# Patient Record
Sex: Male | Born: 1970 | Race: White | Hispanic: No | Marital: Married | Smoking: Never smoker
Health system: Southern US, Community
[De-identification: ages and names within clinical notes are randomized; demographics above are authoritative.]

## PROBLEM LIST (undated history)

## (undated) DIAGNOSIS — I10 Essential (primary) hypertension: Secondary | ICD-10-CM

## (undated) DIAGNOSIS — K648 Other hemorrhoids: Secondary | ICD-10-CM

## (undated) DIAGNOSIS — M109 Gout, unspecified: Secondary | ICD-10-CM

## (undated) HISTORY — DX: Other hemorrhoids: K64.8

## (undated) HISTORY — DX: Gout, unspecified: M10.9

## (undated) HISTORY — DX: Essential (primary) hypertension: I10

---

## 1998-03-27 ENCOUNTER — Other Ambulatory Visit: Admission: RE | Admit: 1998-03-27 | Discharge: 1998-03-27 | Payer: Self-pay | Admitting: Urology

## 1999-10-21 ENCOUNTER — Ambulatory Visit (HOSPITAL_COMMUNITY): Admission: RE | Admit: 1999-10-21 | Discharge: 1999-10-21 | Payer: Self-pay | Admitting: Family Medicine

## 1999-10-21 ENCOUNTER — Encounter: Payer: Self-pay | Admitting: Family Medicine

## 2000-03-15 ENCOUNTER — Ambulatory Visit (HOSPITAL_COMMUNITY): Admission: RE | Admit: 2000-03-15 | Discharge: 2000-03-15 | Payer: Self-pay | Admitting: Orthopedic Surgery

## 2000-03-15 ENCOUNTER — Encounter: Payer: Self-pay | Admitting: Orthopedic Surgery

## 2000-12-27 ENCOUNTER — Ambulatory Visit (HOSPITAL_COMMUNITY): Admission: RE | Admit: 2000-12-27 | Discharge: 2000-12-27 | Payer: Self-pay | Admitting: Orthopedic Surgery

## 2000-12-27 ENCOUNTER — Encounter: Payer: Self-pay | Admitting: Orthopedic Surgery

## 2001-08-11 ENCOUNTER — Emergency Department (HOSPITAL_COMMUNITY): Admission: EM | Admit: 2001-08-11 | Discharge: 2001-08-11 | Payer: Self-pay | Admitting: Emergency Medicine

## 2001-08-11 ENCOUNTER — Encounter: Payer: Self-pay | Admitting: Emergency Medicine

## 2003-04-13 ENCOUNTER — Emergency Department (HOSPITAL_COMMUNITY): Admission: EM | Admit: 2003-04-13 | Discharge: 2003-04-13 | Payer: Self-pay | Admitting: Emergency Medicine

## 2004-03-15 ENCOUNTER — Emergency Department (HOSPITAL_COMMUNITY): Admission: AD | Admit: 2004-03-15 | Discharge: 2004-03-15 | Payer: Self-pay | Admitting: Family Medicine

## 2006-11-17 ENCOUNTER — Encounter: Admission: RE | Admit: 2006-11-17 | Discharge: 2006-11-17 | Payer: Self-pay | Admitting: Orthopedic Surgery

## 2006-11-19 ENCOUNTER — Encounter: Admission: RE | Admit: 2006-11-19 | Discharge: 2006-11-19 | Payer: Self-pay | Admitting: Orthopedic Surgery

## 2011-01-03 ENCOUNTER — Encounter: Payer: Self-pay | Admitting: Orthopedic Surgery

## 2011-05-30 ENCOUNTER — Emergency Department (HOSPITAL_COMMUNITY)
Admission: EM | Admit: 2011-05-30 | Discharge: 2011-05-30 | Disposition: A | Discharge status: Home / Self Care | Payer: BC Managed Care – PPO | Attending: Emergency Medicine | Admitting: Emergency Medicine

## 2011-05-30 DIAGNOSIS — H5710 Ocular pain, unspecified eye: Secondary | ICD-10-CM | POA: Insufficient documentation

## 2011-05-30 DIAGNOSIS — S0560XA Penetrating wound without foreign body of unspecified eyeball, initial encounter: Secondary | ICD-10-CM | POA: Insufficient documentation

## 2011-05-30 DIAGNOSIS — H5789 Other specified disorders of eye and adnexa: Secondary | ICD-10-CM | POA: Insufficient documentation

## 2011-05-30 DIAGNOSIS — W278XXA Contact with other nonpowered hand tool, initial encounter: Secondary | ICD-10-CM | POA: Insufficient documentation

## 2011-05-30 DIAGNOSIS — S0010XA Contusion of unspecified eyelid and periocular area, initial encounter: Secondary | ICD-10-CM | POA: Insufficient documentation

## 2013-04-06 ENCOUNTER — Ambulatory Visit (INDEPENDENT_AMBULATORY_CARE_PROVIDER_SITE_OTHER): Payer: BC Managed Care – PPO | Admitting: Physician Assistant

## 2013-04-06 ENCOUNTER — Encounter: Payer: Self-pay | Admitting: Physician Assistant

## 2013-04-06 VITALS — BP 138/90 | HR 80 | Temp 97.2°F | Resp 18 | Ht 67.0 in | Wt 144.0 lb

## 2013-04-06 DIAGNOSIS — J069 Acute upper respiratory infection, unspecified: Secondary | ICD-10-CM

## 2013-04-06 MED ORDER — FLUTICASONE PROPIONATE 50 MCG/ACT NA SUSP: 2.0000 | Freq: Every day | NASAL | Status: DC

## 2013-04-06 NOTE — Progress Notes (Signed)
   Patient ID: [name] MRN: [id number], DOB: 05/21/1971, 42 y.o. Date of Encounter: 04/06/2013, 11:16 AM    Chief Complaint:  Chief Complaint  Patient presents with  . sore throat, sinus infection, green drainage     HPI: 42 y.o. year old male says he started feeling a little bit "bad" 3 days ago. Then 2 days ago felt really bad. Had severe S.T. Also developed a lot of pressure and congestion in his head with green mucus. Yesterday still felt bad but some better. Today still feeling a little better. Has no sore throat now. Now mostly just feels "stopped up" in his nose. Has no chest congestion. No fever/chills.  Taking otc decongestant.      Home Meds: See attached medication section for meds entered today No current outpatient prescriptions on file prior to visit.   No current facility-administered medications on file prior to visit.    Allergies:  Allergies  Allergen Reactions  . Codeine   . Sulfa Antibiotics       Review of Systems: See Hpi for pertinent positives. All others negative.    Physical Exam: Blood pressure 138/90, pulse 80, temperature 97.2 F (36.2 C), temperature source Oral, resp. rate 18, height 5\' 7"  (1.702 m), weight 144 lb (65.318 kg)., Body mass index is 22.55 kg/(m^2). General: Well developed, well nourished,WM. in no acute distress. HEENT: Normocephalic, atraumatic, eyes without discharge, sclera non-icteric.Nares with edema and erythema. Bilateral auditory canals clear, TM's are without perforation, pearly grey and translucent with reflective cone of light bilaterally. Oral cavity moist, posterior pharynx without exudate, erythema, peritonsillar abscess, or post nasal drip.No tenderness with percussion of sinuses.  Neck: Supple. No thyromegaly. Full ROM. No lymphadenopathy. Lungs: Clear bilaterally to auscultation without wheezes, rales, or rhonchi. Breathing is unlabored. Heart: Regular rhythm. No murmurs, rubs, or gallops. Msk:  Strength  and tone normal for age. Extremities/Skin: Warm and dry. No clubbing or cyanosis. No edema. No rashes or suspicious lesions. Neuro: Alert and oriented X 3. Moves all extremities spontaneously. Gait is normal. CNII-XII grossly in tact. Psych:  Responds to questions appropriately with a normal affect.     ASSESSMENT AND PLAN:  42 y.o. year old male with  1. Acute upper respiratory infections of unspecified site Explained that this is an infection but it is probably caused by a virus. If so, it should gradually improve and resolve within a week. Will add flonase. Cont decongestant. If develops fever or if symptoms get significantly worse or do not resolve in 7-10 days, then call and will add abx.  - fluticasone (FLONASE) 50 MCG/ACT nasal spray; Place 2 sprays into the nose daily.  Dispense: 16 g; Refill: 6   Signed, [address] [place], [place], [facility] 04/06/2013 11:16 AM

## 2013-04-09 ENCOUNTER — Ambulatory Visit: Payer: Self-pay | Admitting: Physician Assistant

## 2013-04-09 ENCOUNTER — Telehealth: Payer: Self-pay | Admitting: Physician Assistant

## 2013-04-10 MED ORDER — AZITHROMYCIN 250 MG PO TABS: ORAL_TABLET | ORAL | Status: DC

## 2013-04-10 NOTE — Telephone Encounter (Signed)
Pt called and antibiotic sent to pharmacy.

## 2013-04-10 NOTE — Telephone Encounter (Signed)
Call pt and tell him I will add antibiotic.  F/U if does not resolve after completion of abx  Send in Rx for ZPack 250 mg : 2 po QD Day one.        1 po QD Days 2-5.        Disp# 6 (one Pack) 0 refill

## 2013-07-10 ENCOUNTER — Telehealth: Payer: Self-pay | Admitting: Family Medicine

## 2013-07-10 MED ORDER — ALPRAZOLAM 0.5 MG PO TBDP: 0.5000 mg | ORAL_TABLET | Freq: Every evening | ORAL | Status: DC | PRN

## 2013-07-10 NOTE — Telephone Encounter (Signed)
Medication refilled per protocol. 

## 2013-07-10 NOTE — Telephone Encounter (Signed)
Please refill 30 with 2 refills

## 2013-10-24 ENCOUNTER — Emergency Department (HOSPITAL_COMMUNITY)
Admission: EM | Admit: 2013-10-24 | Discharge: 2013-10-24 | Disposition: A | Discharge status: Home / Self Care | Payer: BC Managed Care – PPO | Source: Home / Self Care | Attending: Emergency Medicine | Admitting: Emergency Medicine

## 2013-10-24 ENCOUNTER — Encounter (HOSPITAL_COMMUNITY): Payer: Self-pay | Admitting: Emergency Medicine

## 2013-10-24 DIAGNOSIS — J069 Acute upper respiratory infection, unspecified: Secondary | ICD-10-CM

## 2013-10-24 DIAGNOSIS — J019 Acute sinusitis, unspecified: Secondary | ICD-10-CM

## 2013-10-24 MED ORDER — FLUTICASONE PROPIONATE 50 MCG/ACT NA SUSP: 2.0000 | Freq: Every day | NASAL | Status: DC

## 2013-10-24 MED ORDER — PREDNISONE 20 MG PO TABS: 20.0000 mg | ORAL_TABLET | Freq: Two times a day (BID) | ORAL | Status: DC

## 2013-10-24 MED ORDER — AMOXICILLIN-POT CLAVULANATE 875-125 MG PO TABS: 1.0000 | ORAL_TABLET | Freq: Two times a day (BID) | ORAL | Status: DC

## 2013-10-24 NOTE — ED Provider Notes (Signed)
Chief Complaint:   Chief Complaint  Patient presents with  . URI    History of Present Illness:   Franklin Scott is a 42 year old male who has had a six-day history of nasal congestion with green drainage, headache, sinus pressure, itchy eyes, sore throat, cough productive of green sputum, chest tightness, subjective fever, and chills. He denies any chest pain or GI symptoms. He has had no sick exposures. 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  Review of Systems:  Other than noted above, the patient denies any of the following symptoms: Systemic:  No fevers, chills, sweats, weight loss or gain, fatigue, or tiredness. Eye:  No redness or discharge. ENT:  No ear pain, drainage, headache, nasal congestion, drainage, sinus pressure, difficulty swallowing, or sore throat. Neck:  No neck pain or swollen glands. Lungs:  No cough, sputum production, hemoptysis, wheezing, chest tightness, shortness of breath or chest pain. GI:  No abdominal pain, nausea, vomiting or diarrhea.  PMFSH:  Past medical history, family history, social history, meds, and allergies were reviewed. He is allergic to codeine and sulfa. He takes hydrochlorothiazide for blood pressure.  Physical Exam:   Vital signs:  BP 149/90  Pulse 97  Temp(Src) 98.7 F (37.1 C) (Oral)  Resp 18  SpO2 100% General:  Alert and oriented.  In no distress.  Skin warm and dry. Eye:  No conjunctival injection or drainage. Lids were normal. ENT:  TMs and canals were normal, without erythema or inflammation.  Nasal mucosa was congested with clear drainage.  Mucous membranes were moist.  Pharynx was clear with no exudate or drainage.  There were no oral ulcerations or lesions. Neck:  Supple, no adenopathy, tenderness or mass. Lungs:  No respiratory distress.  Lungs were clear to auscultation, without wheezes, rales or rhonchi.  Breath sounds were clear and equal bilaterally.  Heart:  Regular rhythm, without  gallops, murmers or rubs. Skin:  Clear, warm, and dry, without rash or lesions.  Assessment:  The primary encounter diagnosis was Viral upper respiratory infection. A diagnosis of Acute sinus infection was also pertinent to this visit.  Plan:   1.  Meds:  The following meds were prescribed:   New Prescriptions   AMOXICILLIN-CLAVULANATE (AUGMENTIN) 875-125 MG PER TABLET    Take 1 tablet by mouth 2 (two) times daily.   FLUTICASONE (FLONASE) 50 MCG/ACT NASAL SPRAY    Place 2 sprays into both nostrils daily.   PREDNISONE (DELTASONE) 20 MG TABLET    Take 1 tablet (20 mg total) by mouth 2 (two) times daily.    2.  Patient Education/Counseling:  The patient was given appropriate handouts, self care instructions, and instructed in symptomatic relief.   3.  Follow up:  The patient was told to follow up if no better in 7 days, if becoming worse in any way, and given some red flag symptoms such as fever or difficulty breathing which would prompt immediate return.  Follow up here if no better in a week.      Reuben Likes, MD 10/24/13 [phone]

## 2013-10-24 NOTE — ED Notes (Signed)
Pt c/o cold sxs onset Friday  Sxs include: cough w/green phlegm, fevers, chills, congestion Denies: v/n/d Took OTC mucinex w/no relief.  Alert w/no signs of acute distress.

## 2013-11-06 ENCOUNTER — Other Ambulatory Visit: Payer: Self-pay | Admitting: Family Medicine

## 2013-11-06 MED ORDER — HYDROCHLOROTHIAZIDE 25 MG PO TABS: 25.0000 mg | ORAL_TABLET | Freq: Every day | ORAL | Status: DC

## 2013-11-06 NOTE — Telephone Encounter (Signed)
Rx Refilled  

## 2013-11-22 ENCOUNTER — Encounter: Payer: Self-pay | Admitting: Family Medicine

## 2013-11-22 ENCOUNTER — Ambulatory Visit (INDEPENDENT_AMBULATORY_CARE_PROVIDER_SITE_OTHER): Payer: BC Managed Care – PPO | Admitting: Family Medicine

## 2013-11-22 ENCOUNTER — Ambulatory Visit
Admission: RE | Admit: 2013-11-22 | Discharge: 2013-11-22 | Disposition: A | Discharge status: Home / Self Care | Payer: BC Managed Care – PPO | Source: Ambulatory Visit | Attending: Family Medicine | Admitting: Family Medicine

## 2013-11-22 VITALS — BP 140/80 | HR 88 | Temp 97.3°F | Resp 16 | Wt 141.0 lb

## 2013-11-22 DIAGNOSIS — M25561 Pain in right knee: Secondary | ICD-10-CM

## 2013-11-22 DIAGNOSIS — M25569 Pain in unspecified knee: Secondary | ICD-10-CM

## 2013-11-22 MED ORDER — HYDROCODONE-ACETAMINOPHEN 5-325 MG PO TABS: 1.0000 | ORAL_TABLET | Freq: Four times a day (QID) | ORAL | Status: DC | PRN

## 2013-11-22 NOTE — Progress Notes (Signed)
   Subjective:    Patient ID: Franklin Scott, male    DOB: 01-22-1971, 42 y.o.   MRN: [id number]  HPI Patient presents with one week of severe right anteromedial knee pain. He denies any specific injury. He does not remember any twisting or popping in his knee. He has not fallen. However gradually over the last week it has gotten extremely severe to the touch over the medial femoral condyle.  Twisting movements make the pain worse. Walking up and down steps make the pain worse. Putting weight on his leg make the pain worse. Past Medical History  Diagnosis Date  . Hypertension    Current Outpatient Prescriptions on File Prior to Visit  Medication Sig Dispense Refill  . hydrochlorothiazide (HYDRODIURIL) 25 MG tablet Take 1 tablet (25 mg total) by mouth daily.  30 tablet  5   No current facility-administered medications on file prior to visit.   Allergies  Allergen Reactions  . Codeine   . Sulfa Antibiotics    History   Social History  . Marital Status: Married    Spouse Name: N/A    Number of Children: N/A  . Years of Education: N/A   Occupational History  . Not on file.   Social History Main Topics  . Smoking status: Never Smoker   . Smokeless tobacco: Never Used  . Alcohol Use: No  . Drug Use: No  . Sexual Activity: Not on file   Other Topics Concern  . Not on file   Social History Narrative  . No narrative on file      Review of Systems  All other systems reviewed and are negative.       Objective:   Physical Exam  Vitals reviewed. Cardiovascular: Normal rate and regular rhythm.   Pulmonary/Chest: Effort normal and breath sounds normal.  Musculoskeletal:       Right knee: He exhibits decreased range of motion, bony tenderness and abnormal meniscus. He exhibits no swelling, no effusion, normal alignment, no LCL laxity, normal patellar mobility and no MCL laxity. Tenderness found. Medial joint line tenderness noted.          Assessment & Plan:  1.  Right knee pain I suspect meniscal tear. I recommended that he stay off the leg for the next 48 hours. Using sterile technique I injected his right knee with 2 cc lidocaine, 2 cc of Marcaine, and 2 cc of 40 mg mL Kenalog. The patient tolerated procedure well without complication. I'll send him for an x-ray of the right knee immediately to rule out fractures and pathologic lesions in the distal femur. - DG Knee Complete 4 Views Right; Future - HYDROcodone-acetaminophen (NORCO) 5-325 MG per tablet; Take 1 tablet by mouth every 6 (six) hours as needed for moderate pain.  Dispense: 30 tablet; Refill: 0

## 2013-11-26 ENCOUNTER — Encounter: Payer: Self-pay | Admitting: General Practice

## 2013-11-26 NOTE — Telephone Encounter (Signed)
This encounter was created in error - please disregard.

## 2013-11-26 NOTE — Telephone Encounter (Signed)
Pt is calling today to see about his xrays  Call back number is [phone]

## 2013-11-27 ENCOUNTER — Other Ambulatory Visit: Payer: Self-pay | Admitting: Orthopedic Surgery

## 2013-11-27 ENCOUNTER — Ambulatory Visit
Admission: RE | Admit: 2013-11-27 | Discharge: 2013-11-27 | Disposition: A | Discharge status: Home / Self Care | Payer: BC Managed Care – PPO | Source: Ambulatory Visit | Attending: Orthopedic Surgery | Admitting: Orthopedic Surgery

## 2013-11-27 DIAGNOSIS — M25561 Pain in right knee: Secondary | ICD-10-CM

## 2013-11-27 DIAGNOSIS — Z139 Encounter for screening, unspecified: Secondary | ICD-10-CM

## 2013-11-28 ENCOUNTER — Other Ambulatory Visit: Payer: BC Managed Care – PPO

## 2013-11-29 ENCOUNTER — Other Ambulatory Visit: Payer: BC Managed Care – PPO

## 2014-04-15 ENCOUNTER — Emergency Department (HOSPITAL_COMMUNITY)
Admission: EM | Admit: 2014-04-15 | Discharge: 2014-04-16 | Disposition: A | Discharge status: Home / Self Care | Payer: BC Managed Care – PPO | Attending: Emergency Medicine | Admitting: Emergency Medicine

## 2014-04-15 ENCOUNTER — Encounter (HOSPITAL_COMMUNITY): Payer: Self-pay | Admitting: Emergency Medicine

## 2014-04-15 DIAGNOSIS — H10212 Acute toxic conjunctivitis, left eye: Secondary | ICD-10-CM

## 2014-04-15 DIAGNOSIS — H10219 Acute toxic conjunctivitis, unspecified eye: Secondary | ICD-10-CM | POA: Insufficient documentation

## 2014-04-15 DIAGNOSIS — I10 Essential (primary) hypertension: Secondary | ICD-10-CM | POA: Insufficient documentation

## 2014-04-15 NOTE — ED Notes (Signed)
Pt. reports a bug hit his left eye and he rubbed it while spraying herbicide , he flushed it with H2O x 15 mins at home prior to arrival .

## 2014-04-16 MED ORDER — ERYTHROMYCIN 5 MG/GM OP OINT
TOPICAL_OINTMENT | Freq: Once | OPHTHALMIC | Status: AC
  Administered 2014-04-16: 04:00:00 via OPHTHALMIC
  Filled 2014-04-16: qty 1

## 2014-04-16 MED ORDER — ERYTHROMYCIN 5 MG/GM OP OINT
TOPICAL_OINTMENT | OPHTHALMIC | Status: DC
Start: 2014-04-16 — End: 2016-02-27

## 2014-04-16 MED ORDER — TETRACAINE HCL 0.5 % OP SOLN
1.0000 [drp] | Freq: Once | OPHTHALMIC | Status: AC
Start: 2014-04-16 — End: 2014-04-16
  Administered 2014-04-16: 1 [drp] via OPHTHALMIC
  Filled 2014-04-16: qty 2

## 2014-04-16 NOTE — ED Notes (Signed)
Morgans lens started by PA to left eye.

## 2014-04-16 NOTE — ED Provider Notes (Signed)
CSN: [id number]     Arrival date & time 04/15/14  2244 History   First MD Initiated Contact with Patient 04/15/14 2328     Chief Complaint  Patient presents with  . Eye Injury     (Consider location/radiation/quality/duration/timing/severity/associated sxs/prior Treatment) The history is provided by the patient. No language interpreter was used.  Franklin Scott is a 43 y/o M with no known significant PMHx presenting to the ED with left eye pain. Patient reported that at approximately 7:00PM this evening he was working on the farm, Investment banker, operational down herbicide, when something fly into his eye - patient believes it was a Chief of Staff. Stated that when the object flew into his eye, he immediately rubbed his eye with his hands that were exposed to some herbicide as a reaction. Patient reported that he immediately went over to the firehouse where he irrigated his eye for at least 15-20 minutes after the event. Patient reported that he mild discomfort to the left eye - reported that the left eye feels like there is a "tater chip" in it. Patient reported that he noticed some swelling to the eye that has resolved. Denied tearing, drainage, bleeding, pruritis, numbness, tingling, blurred vision, sudden loss of vision. Reported that he is up to date with his tetanus shot. PCP Dr. Dennard Schaumann  Past Medical History  Diagnosis Date  . Hypertension    History reviewed. No pertinent past surgical history. No family history on file. History  Substance Use Topics  . Smoking status: Never Smoker   . Smokeless tobacco: Never Used  . Alcohol Use: No    Review of Systems  Constitutional: Negative for fever and chills.  Eyes: Positive for photophobia, pain and redness. Negative for discharge, itching and visual disturbance.  Respiratory: Negative for chest tightness and shortness of breath.   Cardiovascular: Negative for chest pain.  Neurological: Negative for dizziness, weakness and headaches.  All other systems reviewed  and are negative.     Allergies  Codeine and Sulfa antibiotics  Home Medications   Prior to Admission medications   Medication Sig Start Date End Date Taking? Authorizing Provider  hydrochlorothiazide (HYDRODIURIL) 25 MG tablet Take 1 tablet (25 mg total) by mouth daily. 11/06/13   Susy Frizzle, MD  HYDROcodone-acetaminophen (NORCO) 5-325 MG per tablet Take 1 tablet by mouth every 6 (six) hours as needed for moderate pain. 11/22/13   Susy Frizzle, MD   BP 149/91  Pulse 85  Temp(Src) 98 F (36.7 C) (Oral)  Resp 18  Ht 5\' 6"  (1.676 m)  Wt 140 lb (63.504 kg)  BMI 22.61 kg/m2  SpO2 99% Physical Exam  Nursing note and vitals reviewed. Constitutional: He is oriented to person, place, and time. He appears well-developed and well-nourished. No distress.  HENT:  Head: Normocephalic and atraumatic.  Eyes: Lids are normal. Lids are everted and swept, no foreign bodies found. Right eye exhibits no chemosis, no discharge, no exudate and no hordeolum. No foreign body present in the right eye. Left eye exhibits chemosis (small ). Left eye exhibits no discharge, no exudate and no hordeolum. No foreign body present in the left eye. Right conjunctiva is not injected. Right conjunctiva has no hemorrhage. Left conjunctiva is injected. Left conjunctiva has no hemorrhage. Right eye exhibits normal extraocular motion and no nystagmus. Left eye exhibits normal extraocular motion and no nystagmus.  Fundoscopic exam:      The right eye shows no arteriolar narrowing, no AV nicking, no exudate, no hemorrhage and no  papilledema.       The left eye shows no arteriolar narrowing, no AV nicking, no exudate, no hemorrhage and no papilledema.  Slit lamp exam:      The right eye shows no corneal abrasion, no corneal flare, no corneal ulcer, no foreign body and no hyphema.       The left eye shows no corneal abrasion, no corneal flare, no corneal ulcer, no foreign body, no hyphema, no fluorescein uptake and  no anterior chamber bulge.  Negative nystagmus Visual fields grossly intact   Neck: Normal range of motion. Neck supple. No tracheal deviation present.  Cardiovascular: Normal rate, regular rhythm and normal heart sounds.  Exam reveals no friction rub.   No murmur heard. Pulses:      Radial pulses are 2+ on the right side, and 2+ on the left side.  Pulmonary/Chest: Effort normal and breath sounds normal. No respiratory distress. He has no wheezes. He has no rales.  Musculoskeletal: Normal range of motion.  Full ROM to upper and lower extremities without difficulty noted, negative ataxia noted.  Lymphadenopathy:    He has no cervical adenopathy.  Neurological: He is alert and oriented to person, place, and time. No cranial nerve deficit. He exhibits normal muscle tone. Coordination normal.  Cranial nerves III-XII grossly intact  Skin: Skin is warm and dry. No rash noted. He is not diaphoretic. No erythema.  Psychiatric: He has a normal mood and affect. His behavior is normal. Thought content normal.    ED Course  Procedures (including critical care time) Labs Review Labs Reviewed - No data to display  Imaging Review No results found.   EKG Interpretation None      MDM   Final diagnoses:  Chemical conjunctivitis of left eye    Medications  erythromycin ophthalmic ointment (not administered)  tetracaine (PONTOCAINE) 0.5 % ophthalmic solution 1 drop (1 drop Left Eye Given 04/16/14 0148)   Filed Vitals:   04/15/14 2248  BP: 149/91  Pulse: 85  Temp: 98 F (36.7 C)  TempSrc: Oral  Resp: 18  Height: 5\' 6"  (1.676 m)  Weight: 140 lb (63.504 kg)  SpO2: 99%   Negative corneal abrasion noted. Negative Seidel sign. Negative dendritic lesion. Negative findings of foreign body. Suspicion to be chemical conjunctivitis. Patient seen and assessed by attending physician, Dr. Severiano Gilbert who recommended patient to be placed on antibiotics topically. Patient's eye irrigated with Lilia Pro  lens while in the ED setting. Visual acuity without much concern. Negative swelling or erythema to the eye. Negative discomfort upon palpation to the eye. Visual fields grossly intact. Patient up to date with Tetanus. Patient stable, afebrile. Patient not septic appearing. Discharged patient with erythromycin ointment. [facility] and [facility]mologist. Discussed with patient to apply cool compressions to the eye. Discussed with patient to wear protective eye gear and sunglasses for the next couple of days. Discussed with patient to closely monitor symptoms and if symptoms are to worsen or change to report back to the ED - strict return instructions given.  Patient agreed to plan of care, understood, all questions answered.   Jamse Mead, PA-C 04/17/14 2315

## 2014-04-16 NOTE — Discharge Instructions (Signed)
Please call your doctor for a followup appointment within 24-48 hours. When you talk to your doctor please let them know that you were seen in the emergency department and have them acquire all of your records so that they can discuss the findings with you and formulate a treatment plan to fully care for your new and ongoing problems. Please call and set-up an appointment with the eye doctor to be seen and re-assessed Please rest and stay hydrated Please apply cool compressions to the eye Please wear protective eye gear when outside working, please wear sunglasses to protect her eyes Please continue to monitor symptoms closely and if symptoms are to worsen or change (fever greater than 101, swelling, numbness, tingling, blurred vision, sudden loss of vision, active bleeding or drainage, tearing, worsening pain, swelling to the one side of the face, nasal congestion, chest pain, shortness of breath, difficulty breathing, swelling to the neck, ear pain) please report back to the ED immediately   Chemical Conjunctivitis Chemical conjunctivitis is an irritation of the underside of the eyelid and the white part of the eye. Conjunctivitis can be caused by infection, allergy or chemical irritation. In your case it has been caused by a chemical irritation of the eye. Symptoms almost always include: tearing, light sensitivity, gritty feeling (sensation) in the eyes, swelling of your eyelids, and often severe pain. In spite of the severe pain, this irritation will run its course and will improve within 24 hours.  HOME CARE INSTRUCTIONS   To ease discomfort apply a cool, clean wash cloth to your eye for 10 to 20 minutes, 3 to 4 times per day.  Do not rub your eyes.  Gently wipe away any discharge from the eyes with moistened tissues.  Wash your hands often with soap and use paper towels to dry.  Sunglasses may be helpful if light bothers your eyes.  Do not use eye make-up.  Do not use contact lenses  until the irritation is gone.  Do not operate machinery or drive if your vision is blurred.  Take medications as directed by your caregiver. Artificial tears may ease discomfort.  Avoid the chemical or surroundings which caused the problem. Always use eye protection as necessary. SEEK MEDICAL CARE IF:   The eye is still pink (inflamed) 3 days after beginning treatment.  Pain in the eye increases.  You have discharge coming from either eye.  Your eyelids are stuck together in the morning.  You have an increased sensitivity to light.  An oral temperature above 102 F (38.9 C) develops.  You develop facial pain.  You have any problems that may be related to the medicine you are taking. SEEK IMMEDIATE MEDICAL CARE IF:   Your vision is getting worse.  You develop severe eye pain. MAKE SURE YOU:   Understand these instructions.  Will watch your condition.  Will get help right away if you are not doing well or get worse. Document Released: 09/08/2005 Document Revised: 02/21/2012 Document Reviewed: 07/17/2008 [facility] Patient Information 2014 [place], [place].   Emergency Department Resource Guide 1) Find a Doctor and Pay Out of Pocket Although you won't have to find out who is covered by your insurance plan, it is a good idea to ask around and get recommendations. You will then need to call the office and see if the doctor you have chosen will accept you as a new patient and what types of options they offer for patients who are self-pay. Some doctors offer discounts or will  set up payment plans for their patients who do not have insurance, but you will need to ask so you aren't surprised when you get to your appointment.  2) Contact Your Local Health Department Not all health departments have doctors that can see patients for sick visits, but many do, so it is worth a call to see if yours does. If you don't know where your local health department is, you can check in your phone  book. The CDC also has a tool to help you locate your state's health department, and many state websites also have listings of all of their local health departments.  3) Find a [facility] If your illness is not likely to be very severe or complicated, you may want to try a walk in clinic. These are popping up all over the country in pharmacies, drugstores, and shopping centers. They're usually staffed by nurse practitioners or physician assistants that have been trained to treat common illnesses and complaints. They're usually fairly quick and inexpensive. However, if you have serious medical issues or chronic medical problems, these are probably not your best option.  No Primary Care Doctor: - Call Health Connect at  [phone] - they can help you locate a primary care doctor that  accepts your insurance, provides certain services, etc. - Physician Referral Service- [phone]  Chronic Pain Problems: Organization         Address  Phone   Notes  [facility]  [phone] Patients need to be referred by their primary care doctor.   Medication Assistance: Organization         Address  Phone   Notes  [facility] Medication [facility] [address] [place], [place] [address] [phone] --Must be a resident of [facility] -- Must have NO insurance coverage whatsoever (no Medicaid/ Medicare, etc.) -- The pt. MUST have a primary care doctor that directs their care regularly and follows them in the community   MedAssist  [phone]   [employer]  [phone]    Agencies that provide inexpensive medical care: Organization         Address  Phone   Notes  [place]  [phone]   [facility]    [phone]   [facility] [place], [place] [address] [phone]   [place] [address] [place] [phone]   Planned  P[facility]thood    [phone]   [facility]    [phone]   [address], [place] Phone:  [phone], Fax:  [phone] Hours of Operation:  9 am - 6 pm, M-F.  Also accepts Medicaid/Medicare and self-pay.  [facility] [address], [address], [place] Phone: [phone], Fax: [phone]. Hours of Operation:  8:30 am - 5:30 pm, M-F.  Also accepts Medicaid and self-pay.  [facility] [place] [address] [place] Phone: [phone]   [place], [place], [place] [phone], Ext. [phone] Mondays & Thursdays: 7-9 AM.  First 15 patients are seen on a first come, first serve basis.    [place] Providers:  Organization         Address  Phone   Notes  [facility] [address], [place] [phone]  Also accepts self-pay patients.  [facility] [address], [place]  [phone]   [facility] [address], [place] [phone]   [facility] [address] [place] [address], [place] [phone]) [phone]   Renaye RakersVeita Bland [address], [place]   [phone] Only accepts [place] Access [place]icaid patients after they have their name applied to their card.   Self-Pay (no insurance) in [facility]:  Organization         Address  Phone   Notes  Sickle Cell Patients, [facility] [address], [place] [phone]) [phone]   [facility] [address], [place] [phone]) [phone]   [facility] [place]  [address], [place] [phone]   [facility]/Dr. Osei-Bonsu  [address] [place] [address], [place] or [phone], [place] [phone] Phone number for both [place] and [place] locations is the same.    [facility] [address], [place] [phone]   [facility] [address] [place] [address], [place] or [address] [phone]   [facility] [address], [place] [phone], phone; [phone], fax Sees patients 1st and 3rd Saturday of every month.  Must not qualify for public or private insurance (i.e. Medicaid, Medicare, [place] Health Choice, Veterans' Benefits)  Household income should be no more than 200% of the poverty level The clinic cannot treat you if you are pregnant or think you are pregnant  Sexually transmitted diseases are not treated at the clinic.    Dental Care: Organization         Address  Phone  Notes  [facility] [address], [place] [phone] Accepts children up to age 45 who are enrolled in [place]icaid or [place] Health Choice; pregnant women with a Medicaid card; and children who have applied for Medicaid or [place] Health Choice, but were declined, whose parents can pay a reduced fee at time of service.  [facility] [place]  [address], [place] [phone]) [phone] Accepts children up to age 45 who are enrolled in [place]icaid or [place] Health Choice; pregnant women with a Medicaid card; and children who have applied for Medicaid or [place] Health Choice, but were declined, whose parents can pay a reduced fee at time of service.  [facility]  [address], [place] [phone] Patients are seen by appointment only. Walk-ins are not accepted. [facility] will see patients 45 years of age and older. Monday - Tuesday (8am-5pm) Most Wednesdays (8:30-5pm) $30 per visit, cash only  [facility]  [address], [facility] [phone] Patients are seen by appointment only. Walk-ins are not accepted. [facility] will see patients 1618  years of age and older. One Wednesday Evening (Monthly: Volunteer Based).  $30 per visit, cash only  [employer] [facility]  [phone] for adults; Children under age 404, call Graduate Pediatric Dentistry at [phone]. Children aged 564-14, please call [phone] to request a pediatric application.  Dental services are provided in all areas of dental care including fillings, crowns and bridges, complete and partial dentures, implants, gum treatment, root canals, and extractions. Preventive care is also provided.  Treatment is provided to both adults and children. Patients are selected via a lottery and there is often a waiting list.   [facility] [address], [place]  [phone] [email or web address]   [facility] [address] [place], [place] [phone], Ext. [phone] Second and Fourth Thursday of each month, opens at 6:30 AM; Clinic ends at 9 AM.  Patients are seen on a first-come first-served basis, and a limited number are seen during each clinic.   [facility]  [address] Hillard Danker [place], [place] [phone]   Eligibility Requirements You must have lived in [place], [place], or [place] counties for at least the last three months.   You cannot be eligible for state or federal sponsored [employer], including [employer], [place], or [employer].   You generally cannot be eligible for healthcare insurance through your employer.    How to apply: Eligibility screenings are held every Tuesday and Wednesday afternoon from 1:00 pm until 4:00 pm. You do not need an appointment for the interview!  [facility] [address] [place], [place]   [place]  [place] Department  [place]  [phone]    Behavioral Health Resources in the Community: Intensive Outpatient  Programs Organization         Address  Phone  Notes  [place]. [address] [place], [place] [phone]   [facility] Outpatient [address] [place], [place]   ADS: Alcohol & Drug Svcs [address], [place], [place]   [place] [address]  [place], [place] or [phone]   Substance Abuse Resources Organization         Address  Phone  Notes  Alcohol and Drug Services  [phone]   [place]  [phone]   [facility]   Chinita Pester  [phone]   Residential & Outpatient Substance Abuse Program  [phone]   Psychological Services Organization         Address  Phone  Notes  [facility] [place]  [place]  [phone]   [place] [address], [place] or [phone]    Mobile Crisis Teams Organization         Address  Phone  Notes  [facility], Mobile Crisis Care Unit  [phone]   [facility] Psychotherapeutic [facility]  [address] [place], [place]   [place] Levels [address] [place] [phone]    Self-Help/Support Groups Organization         Address  Phone             Notes  [place]. of [place] - variety of support groups  [place] Call for more information  Narcotics Anonymous (NA), [facility] [address], [employer] [place]  2 meetings at this location   Special educational needs teacher         Address  Phone  Notes  [facility] [place],    [place]  [phone]   [facility]  [address] [place] 824235, [place], [place]   [place], Eugenio Saenz [phone] Admissions: 8am-3pm M-F  Incentives Substance [place] [address]    [place],  [place]  [phone]   [facility] [address] Jadene Pierini [place], [place]   The [place].,  [place], [place]   [facility] Programs - Intensive Outpatient [address] Kristeen [address], [place], [place]   [facility] [address]  [place], [place] [phone] or [phone]   [facility]idential Treatment Services (RTS) [address], [place], [place] Accepts Medicaid  [facility] [place] [address]  [place] [phone] Substance Abuse/Addiction Treatment   [facility] Organization         Address  Phone  Notes  [facility]  [phone]   Domenic Schwab, PhD [address] Arlis Porta [place], [place]   [phone] or [phone]   [place], [place] [phone]   [facility] [address], [place], [place] [phone] Insurance/Medicaid/sponsorship through [facility] and Families [address], [address] [place]                                    [place], [place] [phone] [place] [address], [place] [phone]    Dr. Adele Schilder  [phone]   [facility] Dept. 1) [address] [place] 2) [place] 3)  [place] [address], [place] [phone]  [phone]   [place] [phone] or [phone] (After Hours)

## 2014-04-16 NOTE — ED Notes (Signed)
Morgans lens restarted.  Pt's eye to be irrigated for another 20 minutes per PA.

## 2014-04-19 NOTE — ED Provider Notes (Signed)
Medical screening examination/treatment/procedure(s) were conducted as a shared visit with non-physician practitioner(s) and myself.  I personally evaluated the patient during the encounter.  Conjunctival injection, left greater than right. Pupils equal round reactive to light the    Wynetta Fines, MD 04/19/14 2253

## 2014-06-12 ENCOUNTER — Other Ambulatory Visit: Payer: Self-pay | Admitting: Family Medicine

## 2014-06-12 NOTE — Telephone Encounter (Signed)
Refill appropriate and filled per protocol. 

## 2015-02-16 ENCOUNTER — Other Ambulatory Visit: Payer: Self-pay | Admitting: Family Medicine

## 2016-02-27 ENCOUNTER — Ambulatory Visit (INDEPENDENT_AMBULATORY_CARE_PROVIDER_SITE_OTHER): Payer: BLUE CROSS/BLUE SHIELD | Admitting: Family Medicine

## 2016-02-27 ENCOUNTER — Encounter: Payer: Self-pay | Admitting: Family Medicine

## 2016-02-27 VITALS — BP 140/94 | HR 80 | Temp 97.9°F | Resp 16 | Wt 147.0 lb

## 2016-02-27 DIAGNOSIS — J019 Acute sinusitis, unspecified: Secondary | ICD-10-CM | POA: Diagnosis not present

## 2016-02-27 MED ORDER — PREDNISONE 20 MG PO TABS
ORAL_TABLET | ORAL | Status: DC
Start: 2016-02-27 — End: 2016-04-20

## 2016-02-27 MED ORDER — AMOXICILLIN-POT CLAVULANATE 875-125 MG PO TABS: 1.0000 | ORAL_TABLET | Freq: Two times a day (BID) | ORAL | Status: DC

## 2016-02-27 NOTE — Progress Notes (Signed)
   Subjective:    Patient ID: Franklin Scott, male    DOB: 1971-02-04, 45 y.o.   MRN: [id number]  HPI Patient reports a 9-10 day history of severe head congestion. He is unable to breathe through either nostril. He is having dull constant pressure like pain between his eyes and in his frontal sinuses. He is having constant rhinorrhea and postnasal drip. His teeth hurt. He has a dull headache. He reports subjective fevers. He also reports occasional bright red blood per rectum over the last week. On rectal exam today he has a palpable prolapsed internal hemorrhoid. There are no other rectal masses at least in the area I am able to palpate. Past Medical History  Diagnosis Date  . Hypertension    No past surgical history on file. Current Outpatient Prescriptions on File Prior to Visit  Medication Sig Dispense Refill  . hydrochlorothiazide (HYDRODIURIL) 25 MG tablet TAKE 1 TABLET EVERY DAY 30 tablet 5   No current facility-administered medications on file prior to visit.   Allergies  Allergen Reactions  . Codeine   . Sulfa Antibiotics    Social History   Social History  . Marital Status: Married    Spouse Name: N/A  . Number of Children: N/A  . Years of Education: N/A   Occupational History  . Not on file.   Social History Main Topics  . Smoking status: Never Smoker   . Smokeless tobacco: Never Used  . Alcohol Use: No  . Drug Use: No  . Sexual Activity: Not on file   Other Topics Concern  . Not on file   Social History Narrative      Review of Systems  All other systems reviewed and are negative.      Objective:   Physical Exam  Constitutional: He appears well-developed and well-nourished.  HENT:  Right Ear: Tympanic membrane and ear canal normal.  Left Ear: Tympanic membrane and ear canal normal.  Nose: Mucosal edema and rhinorrhea present. Right sinus exhibits frontal sinus tenderness. Left sinus exhibits frontal sinus tenderness.  Cardiovascular: Normal  rate, regular rhythm and normal heart sounds.  Exam reveals no gallop and no friction rub.   No murmur heard. Pulmonary/Chest: Effort normal and breath sounds normal. No respiratory distress. He has no wheezes. He has no rales.  Abdominal: Soft. Bowel sounds are normal.  Genitourinary: Rectal exam shows internal hemorrhoid.  Vitals reviewed.         Assessment & Plan:  Acute rhinosinusitis - Plan: amoxicillin-clavulanate (AUGMENTIN) 875-125 MG tablet, predniSONE (DELTASONE) 20 MG tablet  Patient has a sinus infection. Begin Augmentin 875 mg by mouth twice a day for 10 days. Begin prednisone taper pack. Rectal exam shows a prolapsed internal hemorrhoid which was easily reduced. I believe this is the source of his bleeding. Should bleeding persist more than one additional week, I would recommend GI consultation just to rule out more significant pathology more proximal in the colon.

## 2016-03-19 ENCOUNTER — Telehealth: Payer: Self-pay | Admitting: General Practice

## 2016-03-19 NOTE — Telephone Encounter (Signed)
Patient would like a call back, would not tell the reason why. [phone]

## 2016-03-22 ENCOUNTER — Other Ambulatory Visit: Payer: Self-pay | Admitting: Family Medicine

## 2016-03-22 DIAGNOSIS — K921 Melena: Secondary | ICD-10-CM

## 2016-03-22 NOTE — Telephone Encounter (Signed)
Pt is having stomach issues and would like to discuss with you only. Please call.

## 2016-03-24 ENCOUNTER — Encounter: Payer: Self-pay | Admitting: Gastroenterology

## 2016-04-15 ENCOUNTER — Encounter: Payer: Self-pay | Admitting: *Deleted

## 2016-04-20 ENCOUNTER — Encounter: Payer: Self-pay | Admitting: Gastroenterology

## 2016-04-20 ENCOUNTER — Ambulatory Visit (INDEPENDENT_AMBULATORY_CARE_PROVIDER_SITE_OTHER): Payer: BLUE CROSS/BLUE SHIELD | Admitting: Gastroenterology

## 2016-04-20 VITALS — BP 132/80 | HR 84 | Ht 67.75 in | Wt 145.4 lb

## 2016-04-20 DIAGNOSIS — R194 Change in bowel habit: Secondary | ICD-10-CM

## 2016-04-20 DIAGNOSIS — K625 Hemorrhage of anus and rectum: Secondary | ICD-10-CM | POA: Diagnosis not present

## 2016-04-20 DIAGNOSIS — K648 Other hemorrhoids: Secondary | ICD-10-CM

## 2016-04-20 MED ORDER — NA SULFATE-K SULFATE-MG SULF 17.5-3.13-1.6 GM/177ML PO SOLN: 1.0000 | Freq: Once | ORAL | Status: DC

## 2016-04-20 NOTE — Patient Instructions (Signed)
Use fiber supplement daily  You have been scheduled for a colonoscopy. Please follow written instructions given to you at your visit today.  Please pick up your prep supplies at the pharmacy within the next 1-3 days. If you use inhalers (even only as needed), please bring them with you on the day of your procedure. Your physician has requested that you go to www.startemmi.com and enter the access code given to you at your visit today. This web site gives a general overview about your procedure. However, you should still follow specific instructions given to you by our office regarding your preparation for the procedure.

## 2016-04-20 NOTE — Progress Notes (Signed)
HPI :  45 y/o male, new to our office, here for symptoms of rectal bleeding and change in bowel habits. He has a history of HTN but does not take medication for this for the most part.   He has had some blood in the stools when wiping himself after bowel movements. He reports a history of hemorrhoids which usually don't bother him too much.  He reports symptoms for 3 weeks most recently. He reports mostly red blood on the toilet paper. He has roughly one BM to upwards of 5 per day, has been irregular. He has some occasional loose stools. He has some lower abdominal cramping associated with the urge to have a bowel movement, and is resolved with a bowel movement. After he eats he has a strong urgency to use the bathroom. He denies any signficant pain in his anal area. He has some straining at times. He has noted some mucous in the stools as well. He reports weight gain, no weight loss.   Father has prostate cancer. No known history of colon cancer. He has never had a prior colonoscopy. Father has Crohns disease.    Past Medical History  Diagnosis Date  . Hypertension   . Internal hemorrhoids      History reviewed. No pertinent past surgical history. no operations Family History  Problem Relation Age of Onset  . Alzheimer's disease Mother   . Prostate cancer Father     mets  . Colon polyps Mother   . Colon polyps Father   . Crohn's disease Father    Social History  Substance Use Topics  . Smoking status: Never Smoker   . Smokeless tobacco: Never Used  . Alcohol Use: No   No current outpatient prescriptions on file.   No current facility-administered medications for this visit.   Allergies  Allergen Reactions  . Codeine   . Sulfa Antibiotics      Review of Systems: All systems reviewed and negative except where noted in HPI.    No results found.  No labs available for review today  Physical Exam: BP 132/80 mmHg  Pulse 84  Ht 5' 7.75" (1.721 m)  Wt 145 lb 6 oz  (65.942 kg)  BMI 22.26 kg/m2 Constitutional: Pleasant,well-developed, male in no acute distress. HEENT: Normocephalic and atraumatic. Conjunctivae are normal. No scleral icterus. Neck supple.  Cardiovascular: Normal rate, regular rhythm.  Pulmonary/chest: Effort normal and breath sounds normal. No wheezing, rales or rhonchi. Abdominal: Soft, nondistended, nontender. Bowel sounds active throughout. There are no masses palpable. No hepatomegaly. DRE / Anoscopy - internal hemorrhoids noted in all positions, no fissure. No mass lesion appreciated Extremities: no edema Lymphadenopathy: No cervical adenopathy noted. Neurological: Alert and oriented to person place and time. Skin: Skin is warm and dry. No rashes noted. Psychiatric: Normal mood and affect. Behavior is normal.   ASSESSMENT AND PLAN: 45 y/o male with history of HTN presenting with a few weeks of ongoing intermittent rectal bleeding with most bowel movements as outlined above. His father has Crohns disease. No FH of CRC. His bowel habits have been a bit irregular lately. Anoscopy showed internal hemorrhoids today which I suspect may be the most likely etiology for his symptoms, however I offered him a colonoscopy to ensure no bleeding polyp / mass lesion, and rule out IBD given his father's history and his bowel habit changes recently. In the interim, recommend a daily fiber supplement to treat for hemorrhoids and see if this gets better.   The  indications, risks, and benefits of colonoscopy were explained to the patient in detail. Risks include but are not limited to bleeding, perforation, adverse reaction to medications, and cardiopulmonary compromise. Sequelae include but are not limited to the possibility of surgery, hospitalization, and mortality. The patient verbalized understanding and wished to proceed. All questions answered, referred to the scheduler and bowel prep ordered. Further recommendations pending results of the exam.    Doctor Phillips Cellar, MD [facility] Pager [phone]  CC: Susy Frizzle, MD

## 2016-04-23 ENCOUNTER — Encounter: Payer: Self-pay | Admitting: Gastroenterology

## 2016-05-03 ENCOUNTER — Encounter: Payer: Self-pay | Admitting: Gastroenterology

## 2016-05-03 ENCOUNTER — Ambulatory Visit (AMBULATORY_SURGERY_CENTER): Payer: BLUE CROSS/BLUE SHIELD | Admitting: Gastroenterology

## 2016-05-03 VITALS — BP 102/77 | HR 75 | Temp 97.1°F | Resp 16 | Ht 67.75 in | Wt 145.0 lb

## 2016-05-03 DIAGNOSIS — K625 Hemorrhage of anus and rectum: Secondary | ICD-10-CM

## 2016-05-03 DIAGNOSIS — D125 Benign neoplasm of sigmoid colon: Secondary | ICD-10-CM

## 2016-05-03 MED ORDER — SODIUM CHLORIDE 0.9 % IV SOLN: 500.0000 mL | INTRAVENOUS | Status: DC

## 2016-05-03 NOTE — Patient Instructions (Signed)
YOU HAD AN ENDOSCOPIC PROCEDURE TODAY AT [facility]:   Refer to the procedure report that was given to you for any specific questions about what was found during the examination.  If the procedure report does not answer your questions, please call your gastroenterologist to clarify.  If you requested that your care partner not be given the details of your procedure findings, then the procedure report has been included in a sealed envelope for you to review at your convenience later.  YOU SHOULD EXPECT: Some feelings of bloating in the abdomen. Passage of more gas than usual.  Walking can help get rid of the air that was put into your GI tract during the procedure and reduce the bloating. If you had a lower endoscopy (such as a colonoscopy or flexible sigmoidoscopy) you may notice spotting of blood in your stool or on the toilet paper. If you underwent a bowel prep for your procedure, you may not have a normal bowel movement for a few days.  Please Note:  You might notice some irritation and congestion in your nose or some drainage.  This is from the oxygen used during your procedure.  There is no need for concern and it should clear up in a day or so.  SYMPTOMS TO REPORT IMMEDIATELY:   Following lower endoscopy (colonoscopy or flexible sigmoidoscopy):  Excessive amounts of blood in the stool  Significant tenderness or worsening of abdominal pains  Swelling of the abdomen that is new, acute  Fever of 100F or higher  For urgent or emergent issues, a gastroenterologist can be reached at any hour by calling [phone].   DIET: Your first meal following the procedure should be a small meal and then it is ok to progress to your normal diet. Heavy or fried foods are harder to digest and may make you feel nauseous or bloated.  Likewise, meals heavy in dairy and vegetables can increase bloating.  Drink plenty of fluids but you should avoid alcoholic beverages for 24  hours.  ACTIVITY:  You should plan to take it easy for the rest of today and you should NOT DRIVE or use heavy machinery until tomorrow (because of the sedation medicines used during the test).    FOLLOW UP: Our staff will call the number listed on your records the next business day following your procedure to check on you and address any questions or concerns that you may have regarding the information given to you following your procedure. If we do not reach you, we will leave a message.  However, if you are feeling well and you are not experiencing any problems, there is no need to return our call.  We will assume that you have returned to your regular daily activities without incident.  If any biopsies were taken you will be contacted by phone or by letter within the next 1-3 weeks.  Please call us at [phone] if you have not heard about the biopsies in 3 weeks.    SIGNATURES/CONFIDENTIALITY: You and/or your care partner have signed paperwork which will be entered into your electronic medical record.  These signatures attest to the fact that that the information above on your After Visit Summary has been reviewed and is understood.  Full responsibility of the confidentiality of this discharge information lies with you and/or your care-partner.  Please read polyp and hemorrhoid handouts provided.

## 2016-05-03 NOTE — Op Note (Signed)
[facility] Patient Name: Franklin Scott Procedure Date: 05/03/2016 3:09 PM MRN: [id number] Endoscopist: Remo Lipps P. Havery Moros , MD Age: 45 Referring MD:  Date of Birth: 1971/10/01 Gender: Male Procedure:                Colonoscopy Indications:              Evaluation of rectal bleeding, new changes in bowel                            habits, family history of Crohns disease, This is                            the patient's first colonoscopy Medicines:                Monitored Anesthesia Care Procedure:                Pre-Anesthesia Assessment:                           - Prior to the procedure, a History and Physical                            was performed, and patient medications and                            allergies were reviewed. The patient's tolerance of                            previous anesthesia was also reviewed. The risks                            and benefits of the procedure and the sedation                            options and risks were discussed with the patient.                            All questions were answered, and informed consent                            was obtained. Prior Anticoagulants: The patient has                            taken no previous anticoagulant or antiplatelet                            agents. ASA Grade Assessment: II - A patient with                            mild systemic disease. After reviewing the risks                            and benefits, the patient was deemed in  satisfactory condition to undergo the procedure.                           After obtaining informed consent, the colonoscope                            was passed under direct vision. Throughout the                            procedure, the patient's blood pressure, pulse, and                            oxygen saturations were monitored continuously. The                            Model CF-HQ190L [id number] [id number]) scope was  introduced                            through the anus and advanced to the the terminal                            ileum, with identification of the appendiceal                            orifice and IC valve. The colonoscopy was performed                            without difficulty. The patient tolerated the                            procedure well. The quality of the bowel                            preparation was adequate. The terminal ileum,                            ileocecal valve, appendiceal orifice, and rectum                            were photographed. Scope In: 3:17:32 PM Scope Out: 3:34:40 PM Scope Withdrawal Time: 0 hours 12 minutes 18 seconds  Total Procedure Duration: 0 hours 17 minutes 8 seconds  Findings:                 The perianal and digital rectal examinations were                            normal.                           The terminal ileum appeared normal.                           A 3 mm polyp was found in the sigmoid colon. The  polyp was sessile. The polyp was removed with a                            cold biopsy forceps. Resection and retrieval were                            complete.                           Non-bleeding internal hemorrhoids were found during                            retroflexion.                           The exam was otherwise without abnormality.                           Biopsies for histology were taken with a cold                            forceps from the right colon and left colon for                            evaluation of microscopic colitis. Complications:            No immediate complications. Estimated blood loss:                            Minimal. Estimated Blood Loss:     Estimated blood loss was minimal. Impression:               - The examined portion of the ileum was normal.                           - One 3 mm polyp in the sigmoid colon, removed with                            a  cold biopsy forceps. Resected and retrieved.                           - Non-bleeding internal hemorrhoids.                           - The examination was otherwise normal.                           - Biopsies were taken with a cold forceps from the                            right colon and left colon for evaluation of                            microscopic colitis.  Overall, suspect symptoms of rectal bleeding are                            most likely due to hemorrhoids. Recommendation:           - Patient has a contact number available for                            emergencies. The signs and symptoms of potential                            delayed complications were discussed with the                            patient. Return to normal activities tomorrow.                            Written discharge instructions were provided to the                            patient.                           - Resume previous diet.                           - Continue present medications.                           - Await pathology results.                           - Repeat colonoscopy is recommended for                            surveillance. The colonoscopy date will be                            determined after pathology results from today's                            exam become available for review.                           - Daily fiber supplement for hemorrhoids, follow up                            as needed in our clinic if further therapy                            (banding) is desired or needed Remo Lipps P. [name], MD 05/03/2016 3:40:16 PM This report has been signed electronically.

## 2016-05-03 NOTE — Progress Notes (Signed)
Called to room to assist during endoscopic procedure.  Patient ID and intended procedure confirmed with present staff. Received instructions for my participation in the procedure from the performing physician.  

## 2016-05-03 NOTE — Progress Notes (Signed)
A/ox3, pleased with MAC, report to RN 

## 2016-05-04 ENCOUNTER — Telehealth: Payer: Self-pay

## 2016-05-04 NOTE — Telephone Encounter (Signed)
  Follow up Call-  Call back number 05/03/2016  Post procedure Call Back phone  # [phone]  Permission to leave phone message Yes     Patient questions:  Do you have a fever, pain , or abdominal swelling? No. Pain Score  0 *  Have you tolerated food without any problems? Yes.    Have you been able to return to your normal activities? Yes.    Do you have any questions about your discharge instructions: Diet   No. Medications  No. Follow up visit  No.  Do you have questions or concerns about your Care? No.  Actions: * If pain score is 4 or above: No action needed, pain <4.

## 2016-05-07 ENCOUNTER — Encounter: Payer: Self-pay | Admitting: Gastroenterology

## 2016-05-24 ENCOUNTER — Ambulatory Visit: Payer: Self-pay | Admitting: Gastroenterology

## 2016-11-25 ENCOUNTER — Encounter: Payer: Self-pay | Admitting: Family Medicine

## 2016-11-25 ENCOUNTER — Ambulatory Visit (INDEPENDENT_AMBULATORY_CARE_PROVIDER_SITE_OTHER): Payer: BLUE CROSS/BLUE SHIELD | Admitting: Physician Assistant

## 2016-11-25 ENCOUNTER — Encounter: Payer: Self-pay | Admitting: Physician Assistant

## 2016-11-25 VITALS — BP 140/80 | HR 86 | Temp 97.9°F | Wt 138.0 lb

## 2016-11-25 DIAGNOSIS — R059 Cough, unspecified: Secondary | ICD-10-CM

## 2016-11-25 DIAGNOSIS — B9689 Other specified bacterial agents as the cause of diseases classified elsewhere: Principal | ICD-10-CM

## 2016-11-25 DIAGNOSIS — R05 Cough: Secondary | ICD-10-CM | POA: Diagnosis not present

## 2016-11-25 DIAGNOSIS — J988 Other specified respiratory disorders: Secondary | ICD-10-CM | POA: Diagnosis not present

## 2016-11-25 DIAGNOSIS — R6889 Other general symptoms and signs: Secondary | ICD-10-CM | POA: Diagnosis not present

## 2016-11-25 LAB — INFLUENZA A AND B AG, IMMUNOASSAY
INFLUENZA A ANTIGEN: NOT DETECTED
INFLUENZA B ANTIGEN: NOT DETECTED

## 2016-11-25 MED ORDER — AZITHROMYCIN 250 MG PO TABS: ORAL_TABLET | ORAL | 0 refills | Status: DC

## 2016-11-25 NOTE — Progress Notes (Signed)
    Patient ID: Franklin Scott MRN: [id number], DOB: 1971-05-07, 45 y.o. Date of Encounter: 11/25/2016, 12:56 PM    Chief Complaint:  Chief Complaint  Patient presents with  . URI  . Cough     HPI: 45 y.o. year old male presents with above.   He states that on Sunday 11/21/16 he had what he thought was just "cold symptoms ". Now he has a really deep cough-- coughing up green phlegm-- and a lot of chest congestion. Feels a lot of congestion in his chest. Has had minimal nasal congestion or sore throat. Has not checked his temperature. 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 Says that he has been working hard and pushing through and now is completely run down and feeling sick. At home it is him in addition to his wife and son. States that neither of them are sick.     Home Meds:   No outpatient prescriptions prior to visit.   No facility-administered medications prior to visit.     Allergies:  Allergies  Allergen Reactions  . Codeine Other (See Comments)    "floor moving"  . Sulfa Antibiotics Nausea Only      Review of Systems: See HPI for pertinent ROS. All other ROS negative.    Physical Exam: Blood pressure 140/80, pulse 86, temperature 97.9 F (36.6 C), temperature source Oral, weight 138 lb (62.6 kg), SpO2 98 %., Body mass index is 21.14 kg/m. General:  WNWD WM. Appears in no acute distress. HEENT: Normocephalic, atraumatic, eyes without discharge, sclera non-icteric, nares are without discharge. Bilateral auditory canals clear, TM's are without perforation, pearly grey and translucent with reflective cone of light bilaterally. Oral cavity moist, posterior pharynx without exudate, erythema, peritonsillar abscess.  Neck: Supple. No thyromegaly. No lymphadenopathy. Lungs: Clear bilaterally to auscultation without wheezes, rales, or rhonchi. Breathing is unlabored. Heart: Regular rhythm. No murmurs, rubs, or gallops. Msk:  Strength and tone normal  for age. Extremities/Skin: Warm and dry.  Neuro: Alert and oriented X 3. Moves all extremities spontaneously. Gait is normal. CNII-XII grossly in tact. Psych:  Responds to questions appropriately with a normal affect.      ASSESSMENT AND PLAN:  45 y.o. year old male with  1. Bacterial respiratory infection Rapid flu test was run and is negative. He is to take antibiotic as directed. Recommend using over-the-counter Mucinex DM as expectorant as well. I am giving him a note to be out of work today and tomorrow and then he is off Saturday and Sunday and will return to work Monday. He is to rest and take antibiotic and Mucinex. Avoid contact with his wife and son and others. Follow-up if symptoms do not resolve within 1 week after completion of antibiotic. - azithromycin (ZITHROMAX) 250 MG tablet; Day 1: Take 2 daily. Days 2-5: Take 1 daily.  Dispense: 6 tablet; Refill: 0   Signed, [address] [place], [place], [facility] 11/25/2016 12:56 PM

## 2017-04-01 ENCOUNTER — Ambulatory Visit (INDEPENDENT_AMBULATORY_CARE_PROVIDER_SITE_OTHER): Payer: BLUE CROSS/BLUE SHIELD | Admitting: Family Medicine

## 2017-04-01 ENCOUNTER — Encounter: Payer: Self-pay | Admitting: Family Medicine

## 2017-04-01 ENCOUNTER — Other Ambulatory Visit: Payer: Self-pay | Admitting: Family Medicine

## 2017-04-01 VITALS — BP 140/86 | HR 80 | Temp 97.9°F | Resp 14 | Ht 70.0 in | Wt 148.0 lb

## 2017-04-01 DIAGNOSIS — S30862S Insect bite (nonvenomous) of penis, sequela: Secondary | ICD-10-CM

## 2017-04-01 DIAGNOSIS — Z0000 Encounter for general adult medical examination without abnormal findings: Secondary | ICD-10-CM

## 2017-04-01 MED ORDER — DOXYCYCLINE HYCLATE 100 MG PO TABS: 100.0000 mg | ORAL_TABLET | Freq: Two times a day (BID) | ORAL | 0 refills | Status: DC

## 2017-04-01 NOTE — Progress Notes (Signed)
   Subjective:    Patient ID: Franklin Scott, male    DOB: March 08, 1971, 46 y.o.   MRN: [id number]  HPI 2 days ago The patient found a tick on the shaft of his penis. It was engorged with blood. He was able to remove the tick in its entirety. There is now a 5 mm red papule with a tick bit him. There is no spreading red ring. There is no residual rash. There are no flulike symptoms  Past Medical History:  Diagnosis Date  . Hypertension   . Internal hemorrhoids    No past surgical history on file. No current outpatient prescriptions on file prior to visit.   No current facility-administered medications on file prior to visit.    Allergies  Allergen Reactions  . Codeine Other (See Comments)    "floor moving"  . Sulfa Antibiotics Nausea Only   Social History   Social History  . Marital status: Married    Spouse name: N/A  . Number of children: 1  . Years of education: N/A   Occupational History  . [employer]    Social History Main Topics  . Smoking status: Never Smoker  . Smokeless tobacco: Never Used  . Alcohol use No  . Drug use: No  . Sexual activity: Not on file   Other Topics Concern  . Not on file   Social History Narrative  . No narrative on file    Review of Systems  All other systems reviewed and are negative.      Objective:   Physical Exam  Cardiovascular: Normal rate, regular rhythm and normal heart sounds.   Pulmonary/Chest: Effort normal and breath sounds normal.  Skin: Rash noted. No erythema.  Vitals reviewed.    5 millimeter red papule on the dorsal shaft of his penis. There is no spreading red ring. There is no petechia purpura in the extremities. There is no evidence of cellulitis. There is no pustule     Assessment & Plan:  Tick Bite Reassured patient that no treatment is necessary at this time. Should he see a spreading red ring developed, he will need to take doxycycline 100 mg twice a day for 21 days. At the present time however there is  no evidence of Lyme disease, Rocky mind spotted fever, or secondary cellulitis.  He will not be charged.

## 2017-04-05 ENCOUNTER — Other Ambulatory Visit: Payer: BLUE CROSS/BLUE SHIELD

## 2017-04-05 DIAGNOSIS — Z0000 Encounter for general adult medical examination without abnormal findings: Secondary | ICD-10-CM

## 2017-04-05 LAB — CBC WITH DIFFERENTIAL/PLATELET
BASOS ABS: 0 {cells}/uL (ref 0–200)
Basophils Relative: 0 %
EOS ABS: 108 {cells}/uL (ref 15–500)
EOS PCT: 2 %
HEMATOCRIT: 45.1 % (ref 38.5–50.0)
HEMOGLOBIN: 15.2 g/dL (ref 13.0–17.0)
LYMPHS PCT: 32 %
Lymphs Abs: 1728 cells/uL (ref 850–3900)
MCH: 28.6 pg (ref 27.0–33.0)
MCHC: 33.7 g/dL (ref 32.0–36.0)
MCV: 84.8 fL (ref 80.0–100.0)
MONO ABS: 486 {cells}/uL (ref 200–950)
MPV: 9.7 fL (ref 7.5–12.5)
Monocytes Relative: 9 %
NEUTROS PCT: 57 %
Neutro Abs: 3078 cells/uL (ref 1500–7800)
Platelets: 319 10*3/uL (ref 140–400)
RBC: 5.32 MIL/uL (ref 4.20–5.80)
RDW: 13.7 % (ref 11.0–15.0)
WBC: 5.4 10*3/uL (ref 3.8–10.8)

## 2017-04-05 LAB — COMPLETE METABOLIC PANEL WITH GFR
AG RATIO: 2 ratio (ref 1.0–2.5)
ALBUMIN: 4.5 g/dL (ref 3.6–5.1)
ALK PHOS: 59 U/L (ref 40–115)
ALT: 18 U/L (ref 9–46)
AST: 20 U/L (ref 10–40)
BILIRUBIN TOTAL: 0.6 mg/dL (ref 0.2–1.2)
BUN / CREAT RATIO: 10.3 ratio (ref 6–22)
BUN: 12 mg/dL (ref 7–25)
CALCIUM: 10.2 mg/dL (ref 8.6–10.3)
CHLORIDE: 102 mmol/L (ref 98–110)
CO2: 23 mmol/L (ref 20–31)
CREATININE: 1.17 mg/dL (ref 0.60–1.35)
GFR, EST AFRICAN AMERICAN: 85 mL/min (ref >=60)
GFR, Est Non African American: 73 mL/min (ref >=60)
Globulin: 2.3 g/dL (ref 1.9–3.7)
Glucose, Bld: 85 mg/dL (ref 70–99)
Potassium: 4.3 mmol/L (ref 3.5–5.3)
Sodium: 140 mmol/L (ref 135–146)
Total Protein: 6.8 g/dL (ref 6.1–8.1)

## 2017-04-05 LAB — LIPID PANEL
CHOLESTEROL: 162 mg/dL (ref <200)
HDL: 52 mg/dL (ref >40)
LDL Cholesterol: 90 mg/dL (ref <100)
TRIGLYCERIDES: 102 mg/dL (ref <150)
Total CHOL/HDL Ratio: 3.1 Ratio (ref <5.0)
VLDL: 20 mg/dL (ref <30)

## 2018-02-24 ENCOUNTER — Telehealth: Payer: Self-pay

## 2018-02-24 NOTE — Telephone Encounter (Signed)
Patient called complaining of a headache, fever, chills, sunny nose, yellowish phlegm, and trouble breathing.  Patient was instructed he could take otc medications for symptom relief and that I could schedule him an appointment for 3/18 however if his symptoms became worse then he would need to go to an urgent care.   Patient states he would try otc medications first

## 2018-02-28 ENCOUNTER — Encounter: Payer: Self-pay | Admitting: Family Medicine

## 2018-02-28 ENCOUNTER — Ambulatory Visit: Payer: BLUE CROSS/BLUE SHIELD | Admitting: Family Medicine

## 2018-02-28 VITALS — BP 150/104 | HR 116 | Temp 97.7°F | Resp 18 | Wt 144.0 lb

## 2018-02-28 DIAGNOSIS — J209 Acute bronchitis, unspecified: Secondary | ICD-10-CM

## 2018-02-28 DIAGNOSIS — B370 Candidal stomatitis: Secondary | ICD-10-CM

## 2018-02-28 MED ORDER — CLOTRIMAZOLE 10 MG MT TROC: 10.0000 mg | Freq: Every day | OROMUCOSAL | 0 refills | Status: DC

## 2018-02-28 MED ORDER — LEVOFLOXACIN 500 MG PO TABS: 500.0000 mg | ORAL_TABLET | Freq: Every day | ORAL | 0 refills | Status: DC

## 2018-02-28 NOTE — Progress Notes (Signed)
Subjective:    Patient ID: Franklin Scott, male    DOB: 1971-09-10, 47 y.o.   MRN: [id number]  HPI Patient began feeling sick Thursday of last week.  Symptoms turned into high fever 101, body aches.  Was seen in urgent care with flu test were negative.  Was diagnosed with bronchitis and was given a Z-Pak.  Symptoms are worsening.  He now reports shortness of breath.  He reports pleurisy bibasilar.  He reports purulent sputum that is brown thick and foul-smelling.  He has thrush on his tongue.  He reports sinus pressure and pain under his nasal bridge. Past Medical History:  Diagnosis Date  . Hypertension   . Internal hemorrhoids    No past surgical history on file. Current Outpatient Medications on File Prior to Visit  Medication Sig Dispense Refill  . azithromycin (ZITHROMAX) 250 MG tablet azithromycin 250 mg tablet  TAKE 2 TABLETS (500 MG) BY ORAL ROUTE ONCE DAILY FOR 1 DAY THEN 1 TABLET (250 MG) BY ORAL ROUTE ONCE DAILY FOR 4 DAYS    . brompheniramine-pseudoephedrine-DM (BROMFED DM) 30-2-10 MG/5ML syrup Bromfed DM 2 mg-30 mg-10 mg/5 mL oral syrup  Take 10 mL 3 times a day by oral route as directed for 7 days.     No current facility-administered medications on file prior to visit.    Allergies  Allergen Reactions  . Codeine Other (See Comments)    "floor moving"  . Sulfa Antibiotics Nausea Only   Social History   Socioeconomic History  . Marital status: Married    Spouse name: Not on file  . Number of children: 1  . Years of education: Not on file  . Highest education level: Not on file  Social Needs  . Financial resource strain: Not on file  . Food insecurity - worry: Not on file  . Food insecurity - inability: Not on file  . Transportation needs - medical: Not on file  . Transportation needs - non-medical: Not on file  Occupational History  . Occupation: [employer]  Tobacco Use  . Smoking status: Never Smoker  . Smokeless tobacco: Never Used  Substance and Sexual  Activity  . Alcohol use: No  . Drug use: No  . Sexual activity: Not on file  Other Topics Concern  . Not on file  Social History Narrative  . Not on file      Review of Systems  All other systems reviewed and are negative.      Objective:   Physical Exam  Constitutional: He appears well-developed and well-nourished. No distress.  HENT:  Right Ear: Tympanic membrane, external ear and ear canal normal.  Left Ear: Tympanic membrane, external ear and ear canal normal.  Nose: Mucosal edema and rhinorrhea present.  Mouth/Throat: Oropharyngeal exudate present. No posterior oropharyngeal edema or posterior oropharyngeal erythema.    Cardiovascular: Normal rate, regular rhythm and normal heart sounds.  Pulmonary/Chest: Effort normal and breath sounds normal. No respiratory distress. He has no wheezes. He has no rales. He exhibits no tenderness.  Skin: He is not diaphoretic.  Vitals reviewed.         Assessment & Plan:  Acute bronchitis, unspecified organism  Thrush  Symptoms worsening on a Z-Pak with negative flu test concerning for walking pneumonia due to Streptococcus.  Discontinue Z-Pak and switch to Levaquin 500 mg p.o. daily for 7 days.  Treat thrush with clotrimazole troche 5 times a day for 2 weeks.  Recheck in 1 week if no better or  sooner if worse.  Discontinue Sudafed due to tachycardia and elevated blood pressure.  Recheck blood pressure in 1 week when better.  If persistently elevated, address hypertension.

## 2019-04-06 ENCOUNTER — Ambulatory Visit: Payer: BLUE CROSS/BLUE SHIELD | Admitting: Family Medicine

## 2019-04-06 ENCOUNTER — Other Ambulatory Visit: Payer: Self-pay

## 2019-04-06 ENCOUNTER — Encounter: Payer: Self-pay | Admitting: Family Medicine

## 2019-04-06 VITALS — BP 147/80 | HR 93 | Temp 98.5°F | Resp 16 | Wt 147.0 lb

## 2019-04-06 DIAGNOSIS — R300 Dysuria: Secondary | ICD-10-CM

## 2019-04-06 DIAGNOSIS — N410 Acute prostatitis: Secondary | ICD-10-CM | POA: Diagnosis not present

## 2019-04-06 LAB — URINALYSIS, ROUTINE W REFLEX MICROSCOPIC
Bilirubin Urine: NEGATIVE
Glucose, UA: NEGATIVE
Hgb urine dipstick: NEGATIVE
Ketones, ur: NEGATIVE
Leukocytes,Ua: NEGATIVE
Nitrite: NEGATIVE
Protein, ur: NEGATIVE
Specific Gravity, Urine: 1.02 (ref 1.001–1.03)
pH: 6 (ref 5.0–8.0)

## 2019-04-06 MED ORDER — CIPROFLOXACIN HCL 500 MG PO TABS: 500.0000 mg | ORAL_TABLET | Freq: Two times a day (BID) | ORAL | 0 refills | Status: DC

## 2019-04-06 MED ORDER — TAMSULOSIN HCL 0.4 MG PO CAPS: 0.4000 mg | ORAL_CAPSULE | Freq: Every day | ORAL | 3 refills | Status: DC

## 2019-04-06 NOTE — Progress Notes (Signed)
Subjective:    Patient ID: Franklin Scott, male    DOB: 01-12-1971, 48 y.o.   MRN: [id number]  HPI Patient states that up until recently he has had some mild problems with urination including increasing frequency and weaker stream.  However 3 days ago, the patient suddenly developed low back pain around the level of L5 located in the center of his back.  It hurts no matter what he is doing.  Movement does not exacerbate the pain.  Lying still does not alleviate the pain.  It is a deep boring pain.  He also reports increasing urinary frequency and urinary urgency however he reports a week and dribbling stream.  When he does urinate, it burns however he then feels better.  He does not feel like he is emptying his bladder completely.  He denies any fevers or chills or myalgias or body aches other than his low back pain.  He denies any nausea or vomiting.  He denies any abdominal pain.  On abdominal palpation today I do not appreciate an enlarged bladder.  Testicular exam is normal.  He denies any testicular pain.  He denies any hematuria or foul odor.  Urinalysis today is completely normal Past Medical History:  Diagnosis Date  . Hypertension   . Internal hemorrhoids    No past surgical history on file. No current outpatient medications on file prior to visit.   No current facility-administered medications on file prior to visit.     Allergies  Allergen Reactions  . Codeine Other (See Comments)    "floor moving"  . Sulfa Antibiotics Nausea Only   Social History   Socioeconomic History  . Marital status: Married    Spouse name: Not on file  . Number of children: 1  . Years of education: Not on file  . Highest education level: Not on file  Occupational History  . Occupation: [employer]  Social Needs  . Financial resource strain: Not on file  . Food insecurity:    Worry: Not on file    Inability: Not on file  . Transportation needs:    Medical: Not on file    Non-medical: Not on file   Tobacco Use  . Smoking status: Never Smoker  . Smokeless tobacco: Never Used  Substance and Sexual Activity  . Alcohol use: No  . Drug use: No  . Sexual activity: Not on file  Lifestyle  . Physical activity:    Days per week: Not on file    Minutes per session: Not on file  . Stress: Not on file  Relationships  . Social connections:    Talks on phone: Not on file    Gets together: Not on file    Attends religious service: Not on file    Active member of club or organization: Not on file    Attends meetings of clubs or organizations: Not on file    Relationship status: Not on file  . Intimate partner violence:    Fear of current or ex partner: Not on file    Emotionally abused: Not on file    Physically abused: Not on file    Forced sexual activity: Not on file  Other Topics Concern  . Not on file  Social History Narrative  . Not on file      Review of Systems  All other systems reviewed and are negative.      Objective:   Physical Exam  Constitutional: He appears well-developed and well-nourished. No  distress.  HENT:  Right Ear: Tympanic membrane and ear canal normal.  Left Ear: Tympanic membrane and ear canal normal.  Cardiovascular: Normal rate, regular rhythm and normal heart sounds.  Pulmonary/Chest: Effort normal and breath sounds normal. No respiratory distress. He has no wheezes. He has no rales. He exhibits no tenderness.  Abdominal: Soft. Bowel sounds are normal. He exhibits no distension. There is no abdominal tenderness. There is no rebound and no guarding.  Genitourinary:    Testes and penis normal.  Prostate is enlarged. Prostate is not tender.  Musculoskeletal:     Lumbar back: He exhibits pain. He exhibits normal range of motion, no tenderness, no bony tenderness, no swelling, no edema, no deformity and no spasm.       Back:  Skin: He is not diaphoretic.  Vitals reviewed.         Assessment & Plan:  Dysuria - Plan: Urinalysis, Routine w  reflex microscopic  Burning with urination - Plan: Urinalysis, Routine w reflex microscopic  I believe the patient has acute prostatitis with urinary retention on top of BPH and this may be causing his lower back pain although I am not certain.  I will treat prostatitis with Cipro 500 mg p.o. twice daily for 10 days and treat the urinary retention and BPH with Flomax 0.4 mg p.o. nightly.  Reassess in 48 hours.  Consider musculoskeletal causes of low back pain if not improving however patient denies any association of the pain with movement.  He denies any radicular symptoms.  He denies any leg weakness, leg numbness.  He denies any neuropathic pain or injury.

## 2019-04-07 LAB — URINE CULTURE
MICRO NUMBER:: 420073
Result:: NO GROWTH
SPECIMEN QUALITY:: ADEQUATE

## 2019-04-20 ENCOUNTER — Other Ambulatory Visit: Payer: Self-pay

## 2019-04-20 ENCOUNTER — Ambulatory Visit: Payer: BLUE CROSS/BLUE SHIELD | Admitting: Family Medicine

## 2019-04-20 VITALS — BP 124/82 | HR 74 | Temp 98.4°F | Resp 18 | Ht 70.0 in | Wt 148.0 lb

## 2019-04-20 DIAGNOSIS — Z0000 Encounter for general adult medical examination without abnormal findings: Secondary | ICD-10-CM

## 2019-04-20 DIAGNOSIS — N410 Acute prostatitis: Secondary | ICD-10-CM | POA: Diagnosis not present

## 2019-04-20 MED ORDER — DOXAZOSIN MESYLATE 1 MG PO TABS: 1.0000 mg | ORAL_TABLET | Freq: Every day | ORAL | 2 refills | Status: DC

## 2019-04-20 NOTE — Progress Notes (Signed)
Subjective:    Patient ID: Franklin Scott, male    DOB: 05-Nov-1971, 48 y.o.   MRN: [id number]  HPI  04/06/19 Patient states that up until recently he has had some mild problems with urination including increasing frequency and weaker stream.  However 3 days ago, the patient suddenly developed low back pain around the level of L5 located in the center of his back.  It hurts no matter what he is doing.  Movement does not exacerbate the pain.  Lying still does not alleviate the pain.  It is a deep boring pain.  He also reports increasing urinary frequency and urinary urgency however he reports a week and dribbling stream.  When he does urinate, it burns however he then feels better.  He does not feel like he is emptying his bladder completely.  He denies any fevers or chills or myalgias or body aches other than his low back pain.  He denies any nausea or vomiting.  He denies any abdominal pain.  On abdominal palpation today I do not appreciate an enlarged bladder.  Testicular exam is normal.  He denies any testicular pain.  He denies any hematuria or foul odor.  Urinalysis today is completely normal.  At that time, my plan was: I believe the patient has acute prostatitis with urinary retention on top of BPH and this may be causing his lower back pain although I am not certain.  I will treat prostatitis with Cipro 500 mg p.o. twice daily for 10 days and treat the urinary retention and BPH with Flomax 0.4 mg p.o. nightly.  Reassess in 48 hours.  Consider musculoskeletal causes of low back pain if not improving however patient denies any association of the pain with movement.  He denies any radicular symptoms.  He denies any leg weakness, leg numbness.  He denies any neuropathic pain or injury.  04/20/19 Patient states the low back pain got better almost immediately upon taking the antibiotics.  Furthermore he has had no further dysuria.  His urinary retention has improved dramatically since starting Flomax.   However on 3 separate occasions since starting Flomax, the patient has experienced retrograde ejaculation.  Patient states that there is no difficulty with erectile dysfunction.  He denies any dysuria or hematuria.  However when the patient ejaculates, there is no visible semen.  Is also painful.  He states he feels like something goes up inside him and that there is a pressure.  He denies any fevers or chills.  He has no residual back pain. Past Medical History:  Diagnosis Date  . Hypertension   . Internal hemorrhoids    No past surgical history on file. Current Outpatient Medications on File Prior to Visit  Medication Sig Dispense Refill  . tamsulosin (FLOMAX) 0.4 MG CAPS capsule Take 1 capsule (0.4 mg total) by mouth daily. 30 capsule 3   No current facility-administered medications on file prior to visit.     Allergies  Allergen Reactions  . Codeine Other (See Comments)    "floor moving"  . Sulfa Antibiotics Nausea Only   Social History   Socioeconomic History  . Marital status: Married    Spouse name: Not on file  . Number of children: 1  . Years of education: Not on file  . Highest education level: Not on file  Occupational History  . Occupation: [employer]  Social Needs  . Financial resource strain: Not on file  . Food insecurity:    Worry: Not on file  Inability: Not on file  . Transportation needs:    Medical: Not on file    Non-medical: Not on file  Tobacco Use  . Smoking status: Never Smoker  . Smokeless tobacco: Never Used  Substance and Sexual Activity  . Alcohol use: No  . Drug use: No  . Sexual activity: Not on file  Lifestyle  . Physical activity:    Days per week: Not on file    Minutes per session: Not on file  . Stress: Not on file  Relationships  . Social connections:    Talks on phone: Not on file    Gets together: Not on file    Attends religious service: Not on file    Active member of club or organization: Not on file    Attends meetings of  clubs or organizations: Not on file    Relationship status: Not on file  . Intimate partner violence:    Fear of current or ex partner: Not on file    Emotionally abused: Not on file    Physically abused: Not on file    Forced sexual activity: Not on file  Other Topics Concern  . Not on file  Social History Narrative  . Not on file      Review of Systems  Genitourinary: Positive for dysuria.  All other systems reviewed and are negative.      Objective:   Physical Exam  Constitutional: He appears well-developed and well-nourished. No distress.  Cardiovascular: Normal rate, regular rhythm and normal heart sounds.  Pulmonary/Chest: Effort normal and breath sounds normal. No respiratory distress. He has no wheezes. He has no rales. He exhibits no tenderness.  Abdominal: Soft. Bowel sounds are normal. He exhibits no distension. There is no abdominal tenderness. There is no rebound and no guarding.  Skin: He is not diaphoretic.  Vitals reviewed.         Assessment & Plan:  General medical exam - Plan: CBC with Differential/Platelet, COMPLETE METABOLIC PANEL WITH GFR, Lipid panel, PSA  Prostatitis, acute  Patient's acute prostatitis has resolved.  However he is experiencing retrograde ejaculation.  This could either be due to the acute prostatitis and some residual swelling or perhaps due to the initiation of Flomax.  We will discontinue Flomax and switch to Cardura 1 mg p.o. daily.  The patient would like to continue trying some type of medication as he is definitely experience less urinary retention since starting Flomax.  Patient's blood pressure today is much better.  While the patient is here, we will also check a CBC, CMP, fasting lipid panel, and a PSA

## 2019-04-21 LAB — COMPLETE METABOLIC PANEL WITH GFR
AG Ratio: 2.1 (calc) (ref 1.0–2.5)
ALT: 20 U/L (ref 9–46)
AST: 23 U/L (ref 10–35)
Albumin: 4.4 g/dL (ref 3.6–5.1)
Alkaline phosphatase (APISO): 46 U/L (ref 35–144)
BUN: 11 mg/dL (ref 7–25)
CO2: 21 mmol/L (ref 20–32)
Calcium: 9.8 mg/dL (ref 8.6–10.3)
Chloride: 107 mmol/L (ref 98–110)
Creat: 1 mg/dL (ref 0.70–1.33)
GFR, Est African American: 101 mL/min/{1.73_m2} (ref >=60)
GFR, Est Non African American: 87 mL/min/{1.73_m2} (ref >=60)
Globulin: 2.1 g/dL (calc) (ref 1.9–3.7)
Glucose, Bld: 86 mg/dL (ref 65–99)
Potassium: 4 mmol/L (ref 3.5–5.3)
Sodium: 138 mmol/L (ref 135–146)
Total Bilirubin: 0.5 mg/dL (ref 0.2–1.2)
Total Protein: 6.5 g/dL (ref 6.1–8.1)

## 2019-04-21 LAB — CBC WITH DIFFERENTIAL/PLATELET
Absolute Monocytes: 484 cells/uL (ref 200–950)
Basophils Absolute: 22 cells/uL (ref 0–200)
Basophils Relative: 0.4 %
Eosinophils Absolute: 61 cells/uL (ref 15–500)
Eosinophils Relative: 1.1 %
HCT: 39.8 % (ref 38.5–50.0)
Hemoglobin: 13.9 g/dL (ref 13.2–17.1)
Lymphs Abs: 1700 cells/uL (ref 850–3900)
MCH: 29.1 pg (ref 27.0–33.0)
MCHC: 34.9 g/dL (ref 32.0–36.0)
MCV: 83.4 fL (ref 80.0–100.0)
MPV: 10 fL (ref 7.5–12.5)
Monocytes Relative: 8.8 %
Neutro Abs: 3234 cells/uL (ref 1500–7800)
Neutrophils Relative %: 58.8 %
Platelets: 325 10*3/uL (ref 140–400)
RBC: 4.77 10*6/uL (ref 4.20–5.80)
RDW: 12.9 % (ref 11.0–15.0)
Total Lymphocyte: 30.9 %
WBC: 5.5 10*3/uL (ref 3.8–10.8)

## 2019-04-21 LAB — LIPID PANEL
Cholesterol: 160 mg/dL (ref <200)
HDL: 51 mg/dL (ref >=40)
LDL Cholesterol (Calc): 92 mg/dL (calc)
Non-HDL Cholesterol (Calc): 109 mg/dL (calc) (ref <130)
Total CHOL/HDL Ratio: 3.1 (calc) (ref <5.0)
Triglycerides: 78 mg/dL (ref <150)

## 2019-04-21 LAB — PSA: PSA: 0.7 ng/mL (ref <=4.0)

## 2019-06-08 ENCOUNTER — Encounter (HOSPITAL_COMMUNITY): Payer: Self-pay

## 2019-06-08 ENCOUNTER — Emergency Department (HOSPITAL_COMMUNITY)
Admission: EM | Admit: 2019-06-08 | Discharge: 2019-06-08 | Disposition: A | Discharge status: Home / Self Care | Payer: BC Managed Care – PPO | Attending: Emergency Medicine | Admitting: Emergency Medicine

## 2019-06-08 ENCOUNTER — Other Ambulatory Visit: Payer: Self-pay

## 2019-06-08 ENCOUNTER — Emergency Department (HOSPITAL_COMMUNITY): Payer: BC Managed Care – PPO

## 2019-06-08 DIAGNOSIS — S0101XA Laceration without foreign body of scalp, initial encounter: Secondary | ICD-10-CM | POA: Diagnosis not present

## 2019-06-08 DIAGNOSIS — Z23 Encounter for immunization: Secondary | ICD-10-CM | POA: Insufficient documentation

## 2019-06-08 DIAGNOSIS — S0990XA Unspecified injury of head, initial encounter: Secondary | ICD-10-CM

## 2019-06-08 DIAGNOSIS — Y929 Unspecified place or not applicable: Secondary | ICD-10-CM | POA: Insufficient documentation

## 2019-06-08 DIAGNOSIS — Y999 Unspecified external cause status: Secondary | ICD-10-CM | POA: Insufficient documentation

## 2019-06-08 DIAGNOSIS — Y939 Activity, unspecified: Secondary | ICD-10-CM | POA: Insufficient documentation

## 2019-06-08 DIAGNOSIS — W2209XA Striking against other stationary object, initial encounter: Secondary | ICD-10-CM | POA: Insufficient documentation

## 2019-06-08 DIAGNOSIS — I10 Essential (primary) hypertension: Secondary | ICD-10-CM | POA: Diagnosis not present

## 2019-06-08 MED ORDER — ACETAMINOPHEN 500 MG PO TABS
1000.0000 mg | ORAL_TABLET | Freq: Once | ORAL | Status: AC
  Administered 2019-06-08: 1000 mg via ORAL
  Filled 2019-06-08: qty 2

## 2019-06-08 MED ORDER — TETANUS-DIPHTH-ACELL PERTUSSIS 5-2.5-18.5 LF-MCG/0.5 IM SUSP
0.5000 mL | Freq: Once | INTRAMUSCULAR | Status: AC
  Administered 2019-06-08: 0.5 mL via INTRAMUSCULAR
  Filled 2019-06-08: qty 0.5

## 2019-06-08 MED ORDER — LIDOCAINE-EPINEPHRINE 2 %-1:100000 IJ SOLN
10.0000 mL | Freq: Once | INTRAMUSCULAR | Status: AC
  Administered 2019-06-08: 1 mL via INTRADERMAL
  Filled 2019-06-08: qty 1

## 2019-06-08 NOTE — ED Notes (Addendum)
Suture tray, I&D cart, and lidocaine at bedside.

## 2019-06-08 NOTE — ED Notes (Signed)
At bedside with provider.

## 2019-06-08 NOTE — ED Triage Notes (Signed)
Patient reports that a dump truck hit the top of his head today. Patient called EMS and they wrapped his head. Wound bleeding some. Patient reports no LOC  And states he had to walk approx 200 yards to get help. Patient denies dizziness or blurred vision.

## 2019-06-08 NOTE — ED Notes (Signed)
Bed: PY09 Expected date:  Expected time:  Means of arrival:  Comments: Hold for triage 1

## 2019-06-08 NOTE — ED Notes (Signed)
Patient transported to CT 

## 2019-06-08 NOTE — ED Provider Notes (Signed)
[facility] DEPT Provider Note  CSN: [id number] Arrival date & time: 06/08/19 1445  Chief Complaint(s) Head Injury  HPI Franklin Scott is a 48 y.o. male    Head Injury Head/neck injury location: top right. Time since incident:  3 hours Mechanism of injury: direct blow   Mechanism of injury comment:  On truck bed Pain details:    Quality:  Dull and throbbing   Severity:  Severe   Timing:  Constant   Progression:  Waxing and waning Chronicity:  New Relieved by:  Nothing Worsened by:  Nothing Associated symptoms: headache   Associated symptoms: no disorientation, no double vision, no focal weakness, no loss of consciousness and no nausea     Past Medical History Past Medical History:  Diagnosis Date  . Hypertension   . Internal hemorrhoids    There are no active problems to display for this patient.  Home Medication(s) Prior to Admission medications   Medication Sig Start Date End Date Taking? Authorizing Provider  Aspirin-Salicylamide-Caffeine (BC HEADACHE POWDER PO) Take 2 packets by mouth daily as needed (headache).   Yes [provider]  doxazosin (CARDURA) 1 MG tablet Take 1 tablet (1 mg total) by mouth daily. 04/20/19  Yes Susy Frizzle, MD  tamsulosin (FLOMAX) 0.4 MG CAPS capsule Take 1 capsule (0.4 mg total) by mouth daily. Patient not taking: Reported on 06/08/2019 04/06/19   Susy Frizzle, MD                                                                                                                                    Past Surgical History History reviewed. No pertinent surgical history. Family History Family History  Problem Relation Age of Onset  . Alzheimer's disease Mother   . Colon polyps Mother   . Prostate cancer Father        mets  . Colon polyps Father   . Crohn's disease Father   . Colon cancer Neg Hx     Social History Social History   Tobacco Use  . Smoking status: Never Smoker  .  Smokeless tobacco: Never Used  Substance Use Topics  . Alcohol use: No  . Drug use: No   Allergies Codeine and Sulfa antibiotics  Review of Systems Review of Systems  Eyes: Negative for double vision.  Gastrointestinal: Negative for nausea.  Neurological: Positive for headaches. Negative for focal weakness and loss of consciousness.   All other systems are reviewed and are negative for acute change except as noted in the HPI  Physical Exam Vital Signs  I have reviewed the triage vital signs BP (!) 149/95   Pulse 73   Temp 98 F (36.7 C) (Oral)   Resp 16   Ht 5\' 9"  (1.753 m)   Wt 63.5 kg   SpO2 99%   BMI 20.67 kg/m   Physical Exam Constitutional:      General: He  is not in acute distress.    Appearance: He is well-developed. He is not diaphoretic.  HENT:     Head: Normocephalic. Laceration present.      Right Ear: External ear normal.     Left Ear: External ear normal.  Eyes:     General: No scleral icterus.       Right eye: No discharge.        Left eye: No discharge.     Conjunctiva/sclera: Conjunctivae normal.     Pupils: Pupils are equal, round, and reactive to light.  Neck:     Musculoskeletal: Normal range of motion and neck supple.  Cardiovascular:     Rate and Rhythm: Regular rhythm.     Pulses:          Radial pulses are 2+ on the right side and 2+ on the left side.       Dorsalis pedis pulses are 2+ on the right side and 2+ on the left side.     Heart sounds: Normal heart sounds. No murmur. No friction rub. No gallop.   Pulmonary:     Effort: Pulmonary effort is normal. No respiratory distress.     Breath sounds: Normal breath sounds. No stridor.  Abdominal:     General: There is no distension.     Palpations: Abdomen is soft.     Tenderness: There is no abdominal tenderness.  Musculoskeletal:     Cervical back: He exhibits no bony tenderness.     Thoracic back: He exhibits no bony tenderness.     Lumbar back: He exhibits no bony tenderness.      Comments: Clavicle stable. Chest stable to AP/Lat compression. Pelvis stable to Lat compression. No obvious extremity deformity. No chest or abdominal wall contusion.  Skin:    General: Skin is warm.  Neurological:     Mental Status: He is alert and oriented to person, place, and time.     GCS: GCS eye subscore is 4. GCS verbal subscore is 5. GCS motor subscore is 6.     Cranial Nerves: Cranial nerves are intact.     Sensory: Sensation is intact.     Motor: Motor function is intact.     Comments: Moving all extremities      ED Results and Treatments Labs (all labs ordered are listed, but only abnormal results are displayed) Labs Reviewed - No data to display                                                                                                                       EKG  EKG Interpretation  Date/Time:    Ventricular Rate:    PR Interval:    QRS Duration:   QT Interval:    QTC Calculation:   R Axis:     Text Interpretation:        Radiology Ct Head Wo Contrast  Result Date: 06/08/2019 CLINICAL DATA:  Head injury, laceration EXAM: CT HEAD WITHOUT CONTRAST TECHNIQUE:  Contiguous axial images were obtained from the base of the skull through the vertex without intravenous contrast. COMPARISON:  None. FINDINGS: Brain: No evidence of acute infarction, hemorrhage, hydrocephalus, extra-axial collection or mass lesion/mass effect. Vascular: No hyperdense vessel or unexpected calcification. Skull: Normal. Negative for fracture or focal lesion. Sinuses/Orbits: No acute finding. Other: Soft tissue laceration of the right forehead. IMPRESSION: 1.  No acute intracranial pathology. 2.  Soft tissue laceration of the right forehead. Electronically Signed   By: Eddie Candle M.D.   On: 06/08/2019 18:34    Pertinent labs & imaging results that were available during my care of the patient were reviewed by me and considered in my medical decision making (see chart for details).   Medications Ordered in ED Medications  Tdap (BOOSTRIX) injection 0.5 mL (0.5 mLs Intramuscular Given 06/08/19 1752)  acetaminophen (TYLENOL) tablet 1,000 mg (1,000 mg Oral Given 06/08/19 1751)  lidocaine-EPINEPHrine (XYLOCAINE W/EPI) 2 %-1:100000 (with pres) injection 10 mL (1 mL Intradermal Given 06/08/19 2001)                                                                                                                                    Procedures .Marland KitchenLaceration Repair  Date/Time: 06/08/2019 9:02 PM Performed by: Fatima Blank, MD Authorized by: Fatima Blank, MD   Consent:    Consent obtained:  Verbal   Consent given by:  Patient   Risks discussed:  Infection, pain and poor cosmetic result   Alternatives discussed:  Delayed treatment Anesthesia (see MAR for exact dosages):    Anesthesia method:  Local infiltration   Local anesthetic:  Lidocaine 2% WITH epi Laceration details:    Location:  Scalp   Scalp location:  Mid-scalp   Length (cm):  6   Depth (mm):  15 Repair type:    Repair type:  Simple Pre-procedure details:    Preparation:  Patient was prepped and draped in usual sterile fashion and imaging obtained to evaluate for foreign bodies Exploration:    Wound extent: no foreign bodies/material noted and no underlying fracture noted   Treatment:    Area cleansed with:  Betadine   Amount of cleaning:  Extensive   Irrigation solution:  Sterile saline   Irrigation volume:  750   Irrigation method:  Syringe Skin repair:    Repair method:  Staples   Number of staples:  6 Approximation:    Approximation:  Close Post-procedure details:    Dressing:  Antibiotic ointment   Patient tolerance of procedure:  Tolerated well, no immediate complications .Marland KitchenLaceration Repair  Date/Time: 06/08/2019 9:03 PM Performed by: Fatima Blank, MD Authorized by: Fatima Blank, MD   Laceration details:    Location:  Scalp   Scalp location:  Mid-scalp    Length (cm):  1.5   Depth (mm):  6 Repair type:    Repair type:  Simple Treatment:    Area cleansed with:  Betadine   Irrigation solution:  Sterile saline   Irrigation volume:  250cc   Irrigation method:  Syringe Skin repair:    Repair method:  Staples   Number of staples:  2 Approximation:    Approximation:  Close Post-procedure details:    Dressing:  Antibiotic ointment    (including critical care time)  Medical Decision Making / ED Course I have reviewed the nursing notes for this encounter and the patient's prior records (if available in EHR or on provided paperwork).  Minor head trauma resulting in lacerations noted above.  CT head without underlying fracture or ICH.  Wounds thoroughly irrigated and closed as above.  Tetanus updated.  Patient instructed to follow-up in 7 days for staple removal.      Final Clinical Impression(s) / ED Diagnoses Final diagnoses:  Injury of head, initial encounter  Laceration of scalp, initial encounter    The patient appears reasonably screened and/or stabilized for discharge and I doubt any other medical condition or other [facility] requiring further screening, evaluation, or treatment in the ED at this time prior to discharge.  Disposition: Discharge  Condition: Good  I have discussed the results, Dx and Tx plan with the patient who expressed understanding and agree(s) with the plan. Discharge instructions discussed at great length. The patient was given strict return precautions who verbalized understanding of the instructions. No further questions at time of discharge.    ED Discharge Orders    None        Follow Up: Susy Frizzle, MD [address] [facility] [address] [place] [address] [phone]   in 5-7 days for staple removal     This chart was dictated using voice recognition software.  Despite best efforts to proofread,  errors can occur which can change the documentation meaning.   Fatima Blank, MD  06/08/19 2108

## 2019-06-13 ENCOUNTER — Other Ambulatory Visit: Payer: Self-pay | Admitting: Family Medicine

## 2019-06-19 ENCOUNTER — Ambulatory Visit (INDEPENDENT_AMBULATORY_CARE_PROVIDER_SITE_OTHER): Payer: BC Managed Care – PPO | Admitting: Family Medicine

## 2019-06-19 ENCOUNTER — Encounter: Payer: Self-pay | Admitting: Family Medicine

## 2019-06-19 ENCOUNTER — Other Ambulatory Visit: Payer: Self-pay

## 2019-06-19 VITALS — BP 132/80 | HR 78 | Temp 98.5°F | Resp 14 | Ht 70.0 in | Wt 146.0 lb

## 2019-06-19 DIAGNOSIS — Z4802 Encounter for removal of sutures: Secondary | ICD-10-CM

## 2019-06-19 NOTE — Progress Notes (Signed)
Subjective:    Patient ID: Franklin Scott, male    DOB: Jun 06, 1971, 48 y.o.   MRN: [id number]  HPI June 26, the patient suffered a laceration to his head when he struck his head on a piece of farm equipment by accident.  He was seen in the emergency room and required staples to close the laceration.  He is here today for staple removal.  There is a vertical laceration over his right forehead that is approximately 3 inches long.  This is closed with 8 staples.  There is dried blood and matted hair as well covering many of the staples. Past Medical History:  Diagnosis Date  . Hypertension   . Internal hemorrhoids    No past surgical history on file. Current Outpatient Medications on File Prior to Visit  Medication Sig Dispense Refill  . Aspirin-Salicylamide-Caffeine (BC HEADACHE POWDER PO) Take 2 packets by mouth daily as needed (headache).    . doxazosin (CARDURA) 1 MG tablet TAKE 1 TABLET BY MOUTH EVERY DAY 90 tablet 0  . tamsulosin (FLOMAX) 0.4 MG CAPS capsule Take 1 capsule (0.4 mg total) by mouth daily. (Patient not taking: Reported on 06/08/2019) 30 capsule 3   No current facility-administered medications on file prior to visit.     Allergies  Allergen Reactions  . Codeine Other (See Comments)    "floor moving"  . Sulfa Antibiotics Nausea Only   Social History   Socioeconomic History  . Marital status: Married    Spouse name: Not on file  . Number of children: 1  . Years of education: Not on file  . Highest education level: Not on file  Occupational History  . Occupation: [employer]  Social Needs  . Financial resource strain: Not on file  . Food insecurity    Worry: Not on file    Inability: Not on file  . Transportation needs    Medical: Not on file    Non-medical: Not on file  Tobacco Use  . Smoking status: Never Smoker  . Smokeless tobacco: Never Used  Substance and Sexual Activity  . Alcohol use: No  . Drug use: No  . Sexual activity: Not on file  Lifestyle  .  Physical activity    Days per week: Not on file    Minutes per session: Not on file  . Stress: Not on file  Relationships  . Social Herbalist on phone: Not on file    Gets together: Not on file    Attends religious service: Not on file    Active member of club or organization: Not on file    Attends meetings of clubs or organizations: Not on file    Relationship status: Not on file  . Intimate partner violence    Fear of current or ex partner: Not on file    Emotionally abused: Not on file    Physically abused: Not on file    Forced sexual activity: Not on file  Other Topics Concern  . Not on file  Social History Narrative  . Not on file      Review of Systems  All other systems reviewed and are negative.      Objective:   Physical Exam  Constitutional: He appears well-developed and well-nourished. No distress.  HENT:  Head: Head is with laceration.    Right Ear: Tympanic membrane and ear canal normal.  Left Ear: Tympanic membrane and ear canal normal.  Cardiovascular: Normal rate, regular rhythm and  normal heart sounds.  Pulmonary/Chest: Effort normal and breath sounds normal. No respiratory distress. He has no wheezes. He has no rales. He exhibits no tenderness.  Genitourinary:    Testes normal.   Skin: He is not diaphoretic.  Vitals reviewed.         Assessment & Plan:  The encounter diagnosis was Encounter for staple removal. Laceration appeals to have healed.  8 staples removed without difficulty.  Wound care as discussed.  Follow-up as needed.

## 2019-09-12 ENCOUNTER — Telehealth: Payer: Self-pay | Admitting: Family Medicine

## 2019-09-12 NOTE — Telephone Encounter (Signed)
Pt is having a hard time sleeping through the night - he is only getting about 2 hours of sleep a night and wanted to know if you would call something in for him?

## 2019-09-13 ENCOUNTER — Encounter (HOSPITAL_COMMUNITY): Payer: Self-pay | Admitting: Emergency Medicine

## 2019-09-13 ENCOUNTER — Emergency Department (HOSPITAL_COMMUNITY): Payer: BC Managed Care – PPO

## 2019-09-13 ENCOUNTER — Other Ambulatory Visit: Payer: Self-pay | Admitting: Family Medicine

## 2019-09-13 ENCOUNTER — Other Ambulatory Visit: Payer: Self-pay

## 2019-09-13 ENCOUNTER — Emergency Department (HOSPITAL_COMMUNITY)
Admission: EM | Admit: 2019-09-13 | Discharge: 2019-09-13 | Disposition: A | Discharge status: Home / Self Care | Payer: BC Managed Care – PPO | Attending: Emergency Medicine | Admitting: Emergency Medicine

## 2019-09-13 DIAGNOSIS — S39012A Strain of muscle, fascia and tendon of lower back, initial encounter: Secondary | ICD-10-CM | POA: Insufficient documentation

## 2019-09-13 DIAGNOSIS — Y929 Unspecified place or not applicable: Secondary | ICD-10-CM | POA: Insufficient documentation

## 2019-09-13 DIAGNOSIS — Y999 Unspecified external cause status: Secondary | ICD-10-CM | POA: Diagnosis not present

## 2019-09-13 DIAGNOSIS — I10 Essential (primary) hypertension: Secondary | ICD-10-CM | POA: Diagnosis not present

## 2019-09-13 DIAGNOSIS — X500XXA Overexertion from strenuous movement or load, initial encounter: Secondary | ICD-10-CM | POA: Diagnosis not present

## 2019-09-13 DIAGNOSIS — Y939 Activity, unspecified: Secondary | ICD-10-CM | POA: Insufficient documentation

## 2019-09-13 DIAGNOSIS — S3992XA Unspecified injury of lower back, initial encounter: Secondary | ICD-10-CM | POA: Diagnosis present

## 2019-09-13 MED ORDER — OXYCODONE-ACETAMINOPHEN 5-325 MG PO TABS
2.0000 | ORAL_TABLET | Freq: Once | ORAL | Status: AC
  Administered 2019-09-13: 2 via ORAL
  Filled 2019-09-13: qty 2

## 2019-09-13 MED ORDER — ZOLPIDEM TARTRATE 10 MG PO TABS: 10.0000 mg | ORAL_TABLET | Freq: Every evening | ORAL | 1 refills | Status: DC | PRN

## 2019-09-13 MED ORDER — IBUPROFEN 600 MG PO TABS: 600.0000 mg | ORAL_TABLET | Freq: Four times a day (QID) | ORAL | 0 refills | Status: DC | PRN

## 2019-09-13 MED ORDER — METHOCARBAMOL 750 MG PO TABS: 750.0000 mg | ORAL_TABLET | Freq: Four times a day (QID) | ORAL | 0 refills | Status: DC

## 2019-09-13 MED ORDER — DIAZEPAM 5 MG PO TABS
5.0000 mg | ORAL_TABLET | Freq: Once | ORAL | Status: AC
  Administered 2019-09-13: 5 mg via ORAL
  Filled 2019-09-13: qty 1

## 2019-09-13 MED ORDER — IBUPROFEN 800 MG PO TABS
800.0000 mg | ORAL_TABLET | Freq: Once | ORAL | Status: AC
  Administered 2019-09-13: 800 mg via ORAL
  Filled 2019-09-13: qty 1

## 2019-09-13 NOTE — ED Provider Notes (Signed)
[facility] DEPT Provider Note   CSN: [id number] Arrival date & time: 09/13/19  1725     History   Chief Complaint Chief Complaint  Patient presents with  . Back Pain    HPI BERLON WIRTHLIN is a 48 y.o. male.     48 year old male presents acute onset of lumbar spine pain after twisting while lifting heavy object today.  Felt a pop in his back but denies any radicular symptoms.  No bowel or bladder dysfunction.  Pain is characterized as sharp and worse with movement and better with remaining still.  No prior history of back trouble.  No treatment use prior to arrival     Past Medical History:  Diagnosis Date  . Hypertension   . Internal hemorrhoids     There are no active problems to display for this patient.   History reviewed. No pertinent surgical history.      Home Medications    Prior to Admission medications   Medication Sig Start Date End Date Taking? Authorizing Provider  Aspirin-Salicylamide-Caffeine (BC HEADACHE POWDER PO) Take 2 packets by mouth daily as needed (headache).    [provider]  doxazosin (CARDURA) 1 MG tablet TAKE 1 TABLET BY MOUTH EVERY DAY 06/13/19   Susy Frizzle, MD  zolpidem (AMBIEN) 10 MG tablet Take 1 tablet (10 mg total) by mouth at bedtime as needed for sleep. 09/13/19 10/13/19  Susy Frizzle, MD    Family History Family History  Problem Relation Age of Onset  . Alzheimer's disease Mother   . Colon polyps Mother   . Prostate cancer Father        mets  . Colon polyps Father   . Crohn's disease Father   . Colon cancer Neg Hx     Social History Social History   Tobacco Use  . Smoking status: Never Smoker  . Smokeless tobacco: Never Used  Substance Use Topics  . Alcohol use: No  . Drug use: No     Allergies   Codeine and Sulfa antibiotics   Review of Systems Review of Systems  All other systems reviewed and are negative.    Physical Exam Updated Vital Signs BP  (!) 147/90   Pulse 97   Temp 98.2 F (36.8 C)   Resp 19   SpO2 99%   Physical Exam Vitals signs and nursing note reviewed.  Constitutional:      General: He is not in acute distress.    Appearance: Normal appearance. He is well-developed. He is not toxic-appearing.  HENT:     Head: Normocephalic and atraumatic.  Eyes:     General: Lids are normal.     Conjunctiva/sclera: Conjunctivae normal.     Pupils: Pupils are equal, round, and reactive to light.  Neck:     Musculoskeletal: Normal range of motion and neck supple.     Thyroid: No thyroid mass.     Trachea: No tracheal deviation.  Cardiovascular:     Rate and Rhythm: Normal rate and regular rhythm.     Heart sounds: Normal heart sounds. No murmur. No gallop.   Pulmonary:     Effort: Pulmonary effort is normal. No respiratory distress.     Breath sounds: Normal breath sounds. No stridor. No decreased breath sounds, wheezing, rhonchi or rales.  Abdominal:     General: Bowel sounds are normal. There is no distension.     Palpations: Abdomen is soft.     Tenderness: There is no  abdominal tenderness. There is no rebound.  Musculoskeletal: Normal range of motion.        General: No tenderness.       Back:  Skin:    General: Skin is warm and dry.     Findings: No abrasion or rash.  Neurological:     Mental Status: He is alert and oriented to person, place, and time.     GCS: GCS eye subscore is 4. GCS verbal subscore is 5. GCS motor subscore is 6.     Cranial Nerves: No cranial nerve deficit.     Sensory: No sensory deficit.     Gait: Gait abnormal.     Comments: Strength is 5 of 5 in upper as well as lower extremities  Psychiatric:        Speech: Speech normal.        Behavior: Behavior normal.      ED Treatments / Results  Labs (all labs ordered are listed, but only abnormal results are displayed) Labs Reviewed - No data to display  EKG None  Radiology No results found.  Procedures Procedures (including  critical care time)  Medications Ordered in ED Medications  oxyCODONE-acetaminophen (PERCOCET/ROXICET) 5-325 MG per tablet 2 tablet (2 tablets Oral Given 09/13/19 1941)  diazepam (VALIUM) tablet 5 mg (5 mg Oral Given 09/13/19 1941)  ibuprofen (ADVIL) tablet 800 mg (800 mg Oral Given 09/13/19 1941)     Initial Impression / Assessment and Plan / ED Course  I have reviewed the triage vital signs and the nursing notes.  Pertinent labs & imaging results that were available during my care of the patient were reviewed by me and considered in my medical decision making (see chart for details).        No evidence of acute fracture on patient's lumbar spine series.  Treated for pain here and does feel slightly better.  Will discharge to home.  Final Clinical Impressions(s) / ED Diagnoses   Final diagnoses:  None    ED Discharge Orders    None       Lacretia Leigh, MD 09/13/19 2017

## 2019-09-13 NOTE — ED Notes (Signed)
Patient transported to X-ray 

## 2019-09-13 NOTE — Telephone Encounter (Signed)
Pt aware.

## 2019-09-13 NOTE — Telephone Encounter (Signed)
I called out a Ambien to his pharmacy

## 2019-09-13 NOTE — ED Triage Notes (Signed)
Pt reports was unloading corn and got severe back pains.

## 2019-12-24 ENCOUNTER — Ambulatory Visit (INDEPENDENT_AMBULATORY_CARE_PROVIDER_SITE_OTHER): Payer: BC Managed Care – PPO | Admitting: Family Medicine

## 2019-12-24 DIAGNOSIS — J310 Chronic rhinitis: Secondary | ICD-10-CM | POA: Diagnosis not present

## 2019-12-24 DIAGNOSIS — R509 Fever, unspecified: Secondary | ICD-10-CM

## 2019-12-24 DIAGNOSIS — J329 Chronic sinusitis, unspecified: Secondary | ICD-10-CM

## 2019-12-24 MED ORDER — AMOXICILLIN 875 MG PO TABS: 875.0000 mg | ORAL_TABLET | Freq: Two times a day (BID) | ORAL | 0 refills | Status: DC

## 2019-12-24 NOTE — Progress Notes (Signed)
Subjective:    Patient ID: Franklin Scott, male    DOB: 1971-10-29, 49 y.o.   MRN: [id number]  HPI Patient is being seen as a telephone visit today.  Phone call began at 405.  Phone call concluded at 410. However then the patient was directed to come to my office so that I can check him for COVID-19.  I evaluated the patient in the parking lot.  Patient has significant head congestion.  He has copious rhinorrhea.  Symptoms have been present for less than a week.  Over the last 2 to 3 days he has developed fever, diffuse body aches, headache, and sinus pain.  He has a history of sinus infection.  He has occasional cough but no shortness of breath.  He denies any chest pain.  He denies any nausea or vomiting or diarrhea.  Past Medical History:  Diagnosis Date  . Hypertension   . Internal hemorrhoids    No past surgical history on file. Current Outpatient Medications on File Prior to Visit  Medication Sig Dispense Refill  . Aspirin-Salicylamide-Caffeine (BC HEADACHE POWDER PO) Take 2 packets by mouth daily as needed (headache).    . doxazosin (CARDURA) 1 MG tablet TAKE 1 TABLET BY MOUTH EVERY DAY 90 tablet 0  . ibuprofen (ADVIL) 600 MG tablet Take 1 tablet (600 mg total) by mouth every 6 (six) hours as needed. 30 tablet 0  . methocarbamol (ROBAXIN-750) 750 MG tablet Take 1 tablet (750 mg total) by mouth 4 (four) times daily. 30 tablet 0  . zolpidem (AMBIEN) 10 MG tablet Take 1 tablet (10 mg total) by mouth at bedtime as needed for sleep. 15 tablet 1   No current facility-administered medications on file prior to visit.   Allergies  Allergen Reactions  . Codeine Other (See Comments)    "floor moving"  . Sulfa Antibiotics Nausea Only   Social History   Socioeconomic History  . Marital status: Married    Spouse name: Not on file  . Number of children: 1  . Years of education: Not on file  . Highest education level: Not on file  Occupational History  . Occupation: [employer]  Tobacco Use   . Smoking status: Never Smoker  . Smokeless tobacco: Never Used  Substance and Sexual Activity  . Alcohol use: No  . Drug use: No  . Sexual activity: Not on file  Other Topics Concern  . Not on file  Social History Narrative  . Not on file   Social Determinants of Health   Financial Resource Strain:   . Difficulty of Paying Living Expenses: Not on file  Food Insecurity:   . Worried About Charity fundraiser in the Last Year: Not on file  . Ran Out of Food in the Last Year: Not on file  Transportation Needs:   . Lack of Transportation (Medical): Not on file  . Lack of Transportation (Non-Medical): Not on file  Physical Activity:   . Days of Exercise per Week: Not on file  . Minutes of Exercise per Session: Not on file  Stress:   . Feeling of Stress : Not on file  Social Connections:   . Frequency of Communication with Friends and Family: Not on file  . Frequency of Social Gatherings with Friends and Family: Not on file  . Attends Religious Services: Not on file  . Active Member of Clubs or Organizations: Not on file  . Attends Archivist Meetings: Not on file  .  Marital Status: Not on file  Intimate Partner Violence:   . Fear of Current or Ex-Partner: Not on file  . Emotionally Abused: Not on file  . Physically Abused: Not on file  . Sexually Abused: Not on file     Review of Systems  All other systems reviewed and are negative.      Objective:   Physical Exam Vitals reviewed.  Constitutional:      Appearance: Normal appearance. He is normal weight. He is not ill-appearing, toxic-appearing or diaphoretic.  HENT:     Nose: Congestion and rhinorrhea present.  Pulmonary:     Effort: Pulmonary effort is normal. No respiratory distress.     Breath sounds: No stridor.  Neurological:     Mental Status: He is alert.            . Assessment & Plan:  Fever in adult - Plan: amoxicillin (AMOXIL) 875 MG tablet, SARS-COV-2 RNA,(COVID-19) QUAL  NAAT  Rhinosinusitis  Patient symptoms are consistent with a viral upper respiratory infection versus a sinus infection versus COVID-19.  I screened the patient today with a COVID-19 test.  Recommended that he quarantine for 14 days from onset of symptoms.  Meanwhile I will treat the patient like a secondary sinusitis given his fever and headaches and sinus pressure with amoxicillin 875 mg p.o. twice daily for 10 days.

## 2019-12-26 LAB — SARS-COV-2 RNA,(COVID-19) QUALITATIVE NAAT: SARS CoV2 RNA: NOT DETECTED

## 2019-12-27 ENCOUNTER — Telehealth: Payer: Self-pay | Admitting: Family Medicine

## 2019-12-27 ENCOUNTER — Encounter: Payer: Self-pay | Admitting: Family Medicine

## 2019-12-27 NOTE — Telephone Encounter (Signed)
Patient got negative results from his covid test would like to know if he can have a note for Monday Tuesday and Wednesday and go back tonight for work  Would need this today if possible

## 2019-12-27 NOTE — Telephone Encounter (Signed)
I wrote note and gave to front staff to call patient.

## 2020-01-23 ENCOUNTER — Telehealth: Payer: Self-pay | Admitting: Family Medicine

## 2020-01-23 NOTE — Telephone Encounter (Signed)
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 He is having some think yellow drainage and nasal congestion. He was asking if he needed to do anything about it or if he needed another antibx?

## 2020-01-24 MED ORDER — FLUTICASONE PROPIONATE 50 MCG/ACT NA SUSP: 2.0000 | Freq: Every day | NASAL | 6 refills | Status: DC

## 2020-01-24 MED ORDER — LEVOCETIRIZINE DIHYDROCHLORIDE 5 MG PO TABS: 5.0000 mg | ORAL_TABLET | Freq: Every evening | ORAL | 3 refills | Status: DC

## 2020-01-24 NOTE — Telephone Encounter (Signed)
Med sent to pharm and pt made aware

## 2020-01-24 NOTE — Telephone Encounter (Signed)
Abx won't help allergic congestion.  I would use flonase and xyzal

## 2020-02-10 IMAGING — CT CT HEAD WITHOUT CONTRAST
3 series · 15 of 47 positions shown, 18 images · non-contrast
Comparison: None.

CLINICAL DATA: Head injury, laceration

EXAM:
CT HEAD WITHOUT CONTRAST
TECHNIQUE: Contiguous axial images were obtained from the base of the skull
through the vertex without intravenous contrast.

[Series 2: head wo · axial · 0.44mm/px · z∈[-78,+52]mm · 9 of 32 slices shown, 12 images]
[im 3/32  brain]
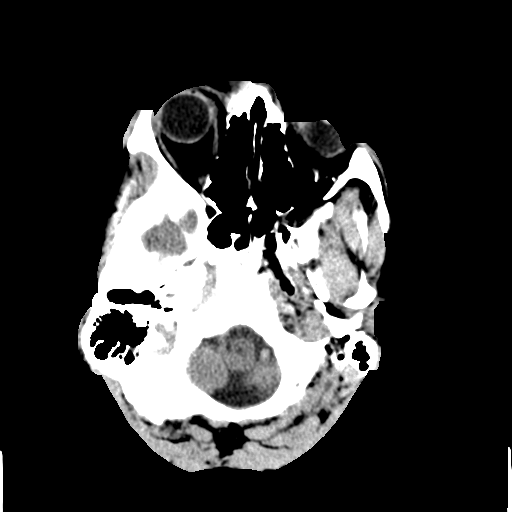
[im 3/32  bone]
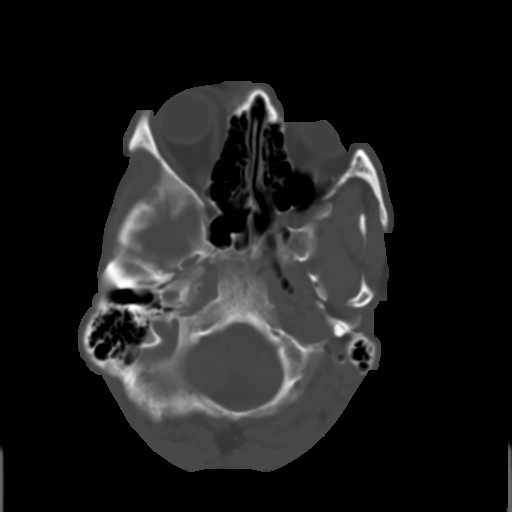
[im 6/32  brain]
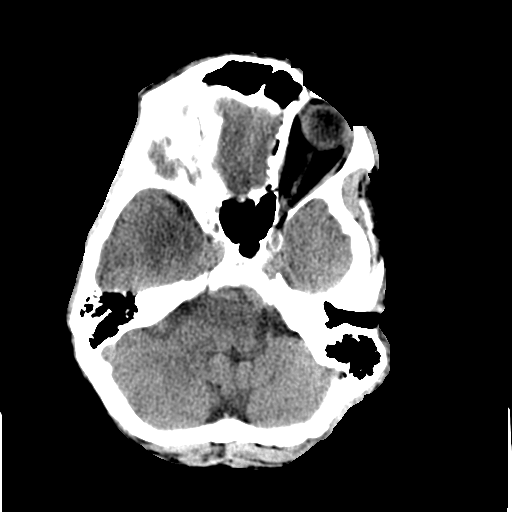
[im 9/32  brain]
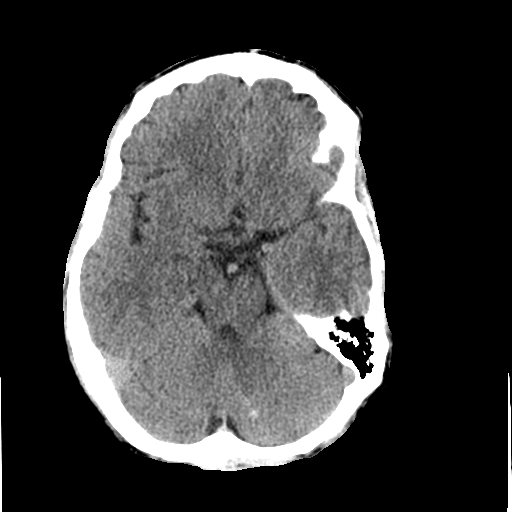
[im 12/32  brain]
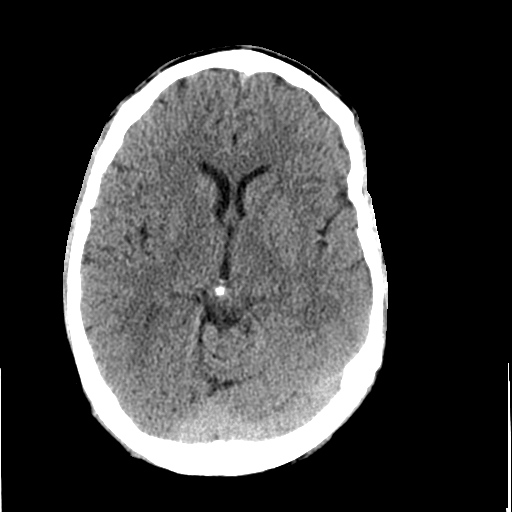
[im 17/32  brain]
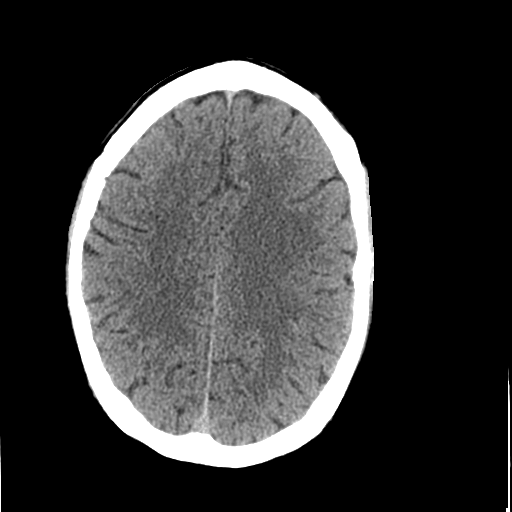
[im 17/32  bone]
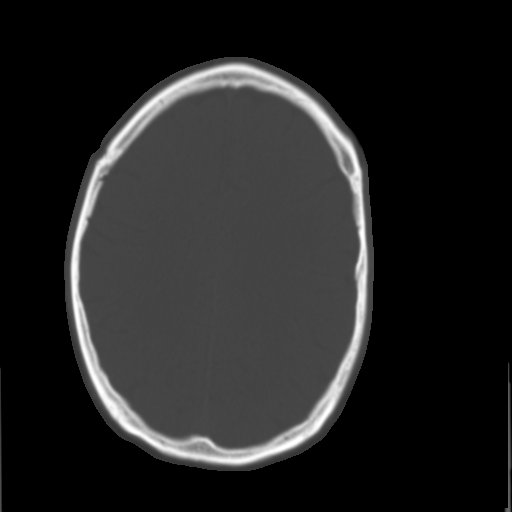
[im 20/32  brain]
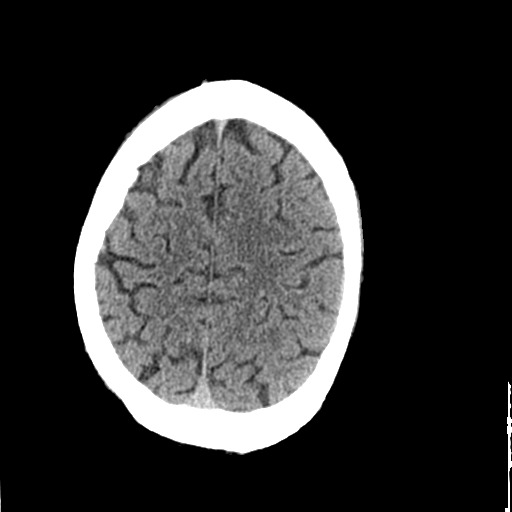
[im 23/32  brain]
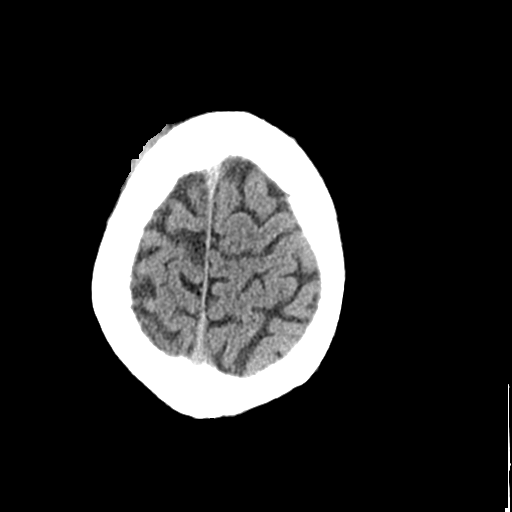
[im 26/32  brain]
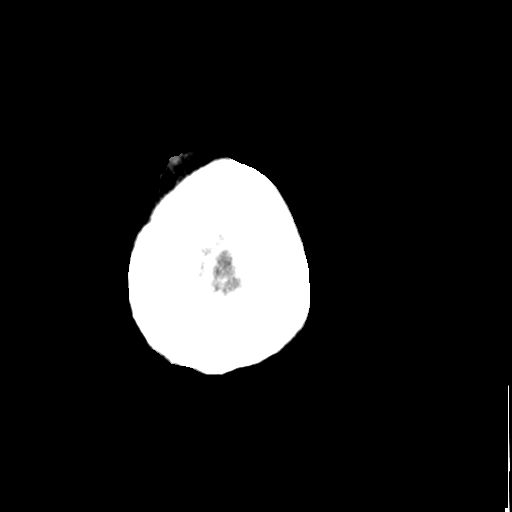
[im 29/32  brain]
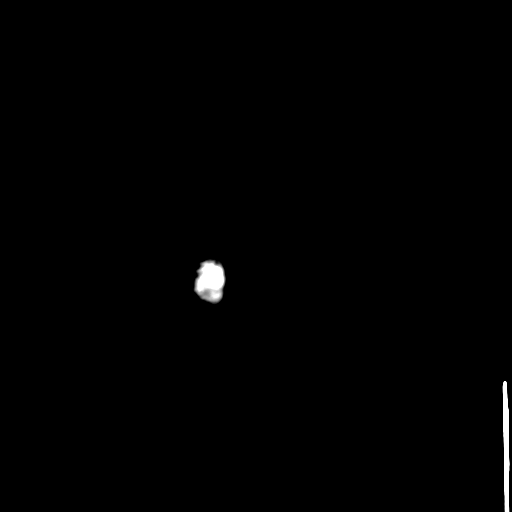
[im 29/32  bone]
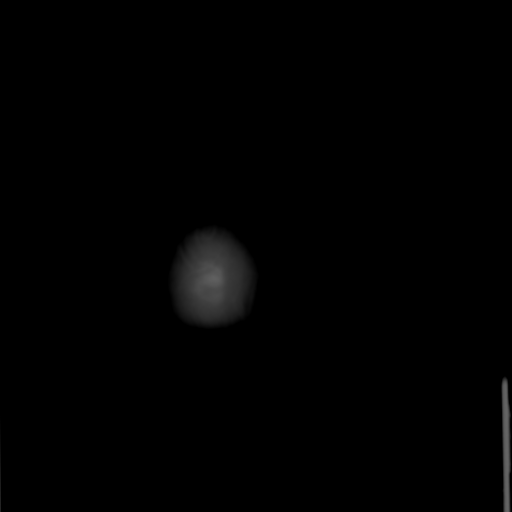

[Series 5: coronal soft tissue · coronal · 0.31mm/px · 3 of 70 slices shown]
[im 24/70  brain]
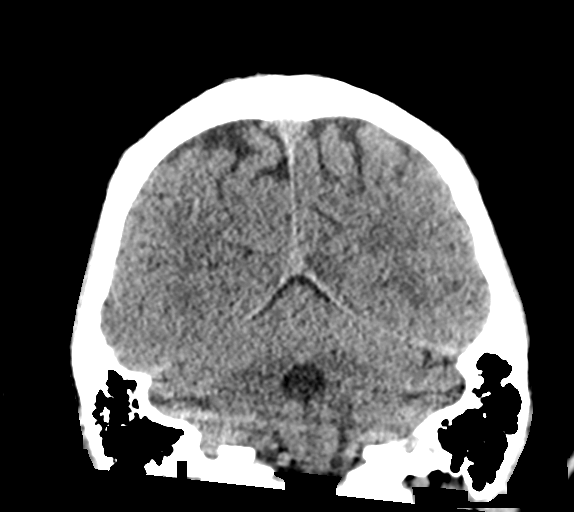
[im 31/70  brain]
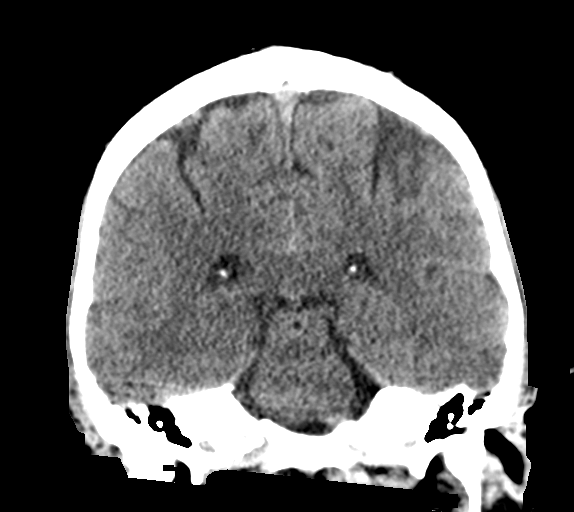
[im 39/70  brain]
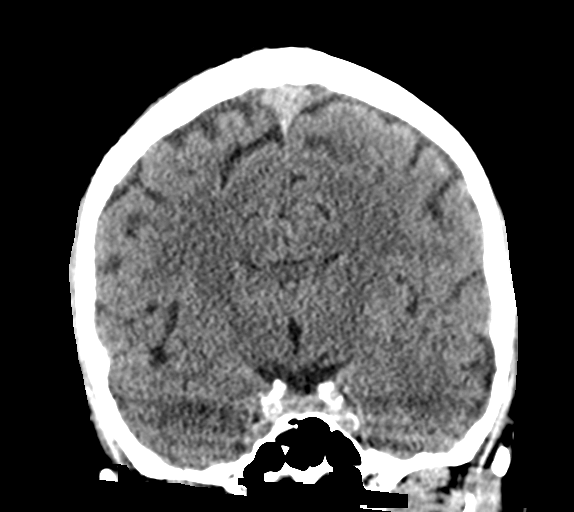

[Series 6: sagittal soft tissue · sagittal · 0.31mm/px · 3 of 57 slices shown]
[im 19/57  brain]
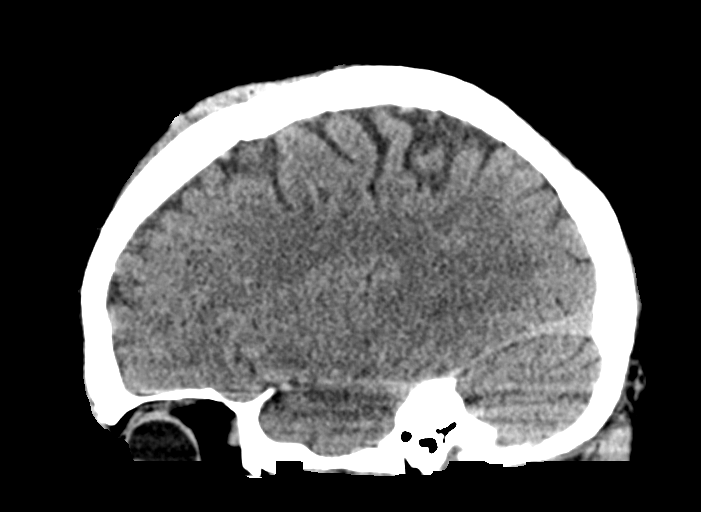
[im 29/57  brain]
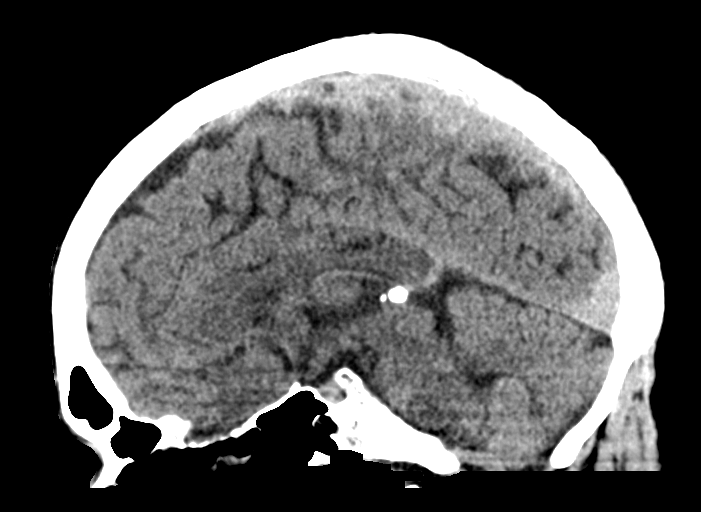
[im 38/57  brain]
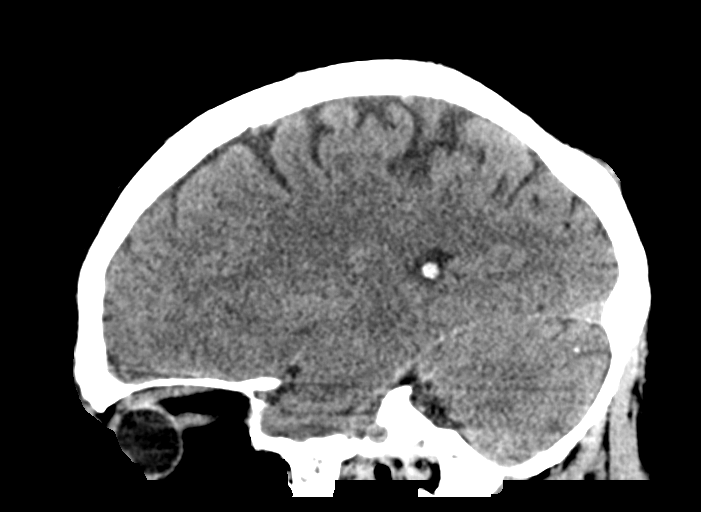

[15 of 47 positions shown; findings below may reference images not displayed]

FINDINGS: Brain: No evidence of acute infarction, hemorrhage, hydrocephalus,
extra-axial collection or mass lesion/mass effect.

Vascular: No hyperdense vessel or unexpected calcification.

Skull: Normal. Negative for fracture or focal lesion.

Sinuses/Orbits: No acute finding.

Other: Soft tissue laceration of the right forehead.
IMPRESSION: 1.  No acute intracranial pathology.

2.  Soft tissue laceration of the right forehead.

## 2020-08-20 ENCOUNTER — Telehealth: Payer: Self-pay | Admitting: Family Medicine

## 2020-08-20 NOTE — Telephone Encounter (Signed)
Pt would like to be referred to [facility], had tested positive for Covid.

## 2020-08-20 NOTE — Telephone Encounter (Signed)
CB# [phone]  Tested positive  for covid would like to know if Dr. Dennard Schaumann suggest getting MAP infusion

## 2020-08-21 ENCOUNTER — Other Ambulatory Visit (HOSPITAL_COMMUNITY): Payer: Self-pay | Admitting: Family

## 2020-08-21 ENCOUNTER — Telehealth (HOSPITAL_COMMUNITY): Payer: Self-pay | Admitting: Family

## 2020-08-21 DIAGNOSIS — U071 COVID-19: Secondary | ICD-10-CM

## 2020-08-21 NOTE — Progress Notes (Signed)
I connected by phone with Franklin Scott on 08/21/2020 at 7:21 PM to discuss the potential use of a new treatment for mild to moderate COVID-19 viral infection in non-hospitalized patients.  This patient is a 49 y.o. male that meets the FDA criteria for Emergency Use Authorization of COVID monoclonal antibody casirivimab/imdevimab.  Has a (+) direct SARS-CoV-2 viral test result  Has mild or moderate COVID-19   Is NOT hospitalized due to COVID-19  Is within 10 days of symptom onset  Has at least one of the high risk factor(s) for progression to severe COVID-19 and/or hospitalization as defined in EUA.  Specific high risk criteria : Cardiovascular disease or hypertension   Symptoms of fever, body aches, cough, nausea began 08/17/20.    I have spoken and communicated the following to the patient or parent/caregiver regarding COVID monoclonal antibody treatment:  1. FDA has authorized the emergency use for the treatment of mild to moderate COVID-19 in adults and pediatric patients with positive results of direct SARS-CoV-2 viral testing who are 59 years of age and older weighing at least 40 kg, and who are at high risk for progressing to severe COVID-19 and/or hospitalization.  2. The significant known and potential risks and benefits of COVID monoclonal antibody, and the extent to which such potential risks and benefits are unknown.  3. Information on available alternative treatments and the risks and benefits of those alternatives, including clinical trials.  4. Patients treated with COVID monoclonal antibody should continue to self-isolate and use infection control measures (e.g., wear mask, isolate, social distance, avoid sharing personal items, clean and disinfect "high touch" surfaces, and frequent handwashing) according to CDC guidelines.   5. The patient or parent/caregiver has the option to accept or refuse COVID monoclonal antibody treatment.  After reviewing this information with  the patient, The patient agreed to proceed with receiving casirivimab\imdevimab infusion and will be provided a copy of the Fact sheet prior to receiving the infusion. Franklin Scott Laurann Montana 08/21/2020 7:21 PM

## 2020-08-21 NOTE — Telephone Encounter (Signed)
Message sent to infusion center.

## 2020-08-21 NOTE — Telephone Encounter (Signed)
Please schedule for regeneron

## 2020-08-21 NOTE — Telephone Encounter (Signed)
Called to discuss with Franklin Scott about Covid symptoms and the use of casirivimab/imdevimab, a combination monoclonal antibody infusion for those with mild to moderate Covid symptoms and at a high risk of hospitalization.     Pt is qualified for this infusion at the infusion center due to co-morbid conditions and/or a member of an at-risk group, however declines infusion at this time. Symptoms tier reviewed as well as criteria for ending isolation.  Symptoms reviewed that would warrant ED/Hospital evaluation. Preventative practices reviewed. Patient verbalized understanding. Patient advised to call back if he decides that he does want to get infusion. Callback number to the infusion center given. Patient advised to go to Urgent care or ED with severe symptoms.   Last date he would be eligible for infusion is 08/26/20. Symptoms of fever, body aches, cough, and nausea began 08/17/2020. History of HTN.    Krish Bailly,NP

## 2020-08-22 ENCOUNTER — Ambulatory Visit (HOSPITAL_COMMUNITY)
Admission: RE | Admit: 2020-08-22 | Discharge: 2020-08-22 | Disposition: A | Discharge status: Home / Self Care | Payer: BC Managed Care – PPO | Source: Ambulatory Visit | Attending: Pulmonary Disease | Admitting: Pulmonary Disease

## 2020-08-22 DIAGNOSIS — U071 COVID-19: Secondary | ICD-10-CM | POA: Insufficient documentation

## 2020-08-22 MED ORDER — DIPHENHYDRAMINE HCL 50 MG/ML IJ SOLN: 50.0000 mg | Freq: Once | INTRAMUSCULAR | Status: DC | PRN

## 2020-08-22 MED ORDER — SODIUM CHLORIDE 0.9 % IV SOLN
1200.0000 mg | Freq: Once | INTRAVENOUS | Status: AC
  Administered 2020-08-22: 1200 mg via INTRAVENOUS

## 2020-08-22 MED ORDER — METHYLPREDNISOLONE SODIUM SUCC 125 MG IJ SOLR: 125.0000 mg | Freq: Once | INTRAMUSCULAR | Status: DC | PRN

## 2020-08-22 MED ORDER — FAMOTIDINE IN NACL 20-0.9 MG/50ML-% IV SOLN: 20.0000 mg | Freq: Once | INTRAVENOUS | Status: DC | PRN

## 2020-08-22 MED ORDER — EPINEPHRINE 0.3 MG/0.3ML IJ SOAJ: 0.3000 mg | Freq: Once | INTRAMUSCULAR | Status: DC | PRN

## 2020-08-22 MED ORDER — ALBUTEROL SULFATE HFA 108 (90 BASE) MCG/ACT IN AERS: 2.0000 | INHALATION_SPRAY | Freq: Once | RESPIRATORY_TRACT | Status: DC | PRN

## 2020-08-22 MED ORDER — SODIUM CHLORIDE 0.9 % IV SOLN: INTRAVENOUS | Status: DC | PRN

## 2020-08-22 NOTE — Discharge Instructions (Signed)
COVID-19 COVID-19 is a respiratory infection that is caused by a virus called severe acute respiratory syndrome coronavirus 2 (SARS-CoV-2). The disease is also known as coronavirus disease or novel coronavirus. In some people, the virus may not cause any symptoms. In others, it may cause a serious infection. The infection can get worse quickly and can lead to complications, such as:  Pneumonia, or infection of the lungs.  Acute respiratory distress syndrome or ARDS. This is a condition in which fluid build-up in the lungs prevents the lungs from filling with air and passing oxygen into the blood.  Acute respiratory failure. This is a condition in which there is not enough oxygen passing from the lungs to the body or when carbon dioxide is not passing from the lungs out of the body.  Sepsis or septic shock. This is a serious bodily reaction to an infection.  Blood clotting problems.  Secondary infections due to bacteria or fungus.  Organ failure. This is when your body's organs stop working. The virus that causes COVID-19 is contagious. This means that it can spread from person to person through droplets from coughs and sneezes (respiratory secretions). What are the causes? This illness is caused by a virus. You may catch the virus by:  Breathing in droplets from an infected person. Droplets can be spread by a person breathing, speaking, singing, coughing, or sneezing.  Touching something, like a table or a doorknob, that was exposed to the virus (contaminated) and then touching your mouth, nose, or eyes. What increases the risk? Risk for infection You are more likely to be infected with this virus if you:  Are within 6 feet (2 meters) of a person with COVID-19.  Provide care for or live with a person who is infected with COVID-19.  Spend time in crowded indoor spaces or live in shared housing. Risk for serious illness You are more likely to become seriously ill from the virus if  you:  Are 50 years of age or older. The higher your age, the more you are at risk for serious illness.  Live in a nursing home or long-term care facility.  Have cancer.  Have a long-term (chronic) disease such as: ? Chronic lung disease, including chronic obstructive pulmonary disease or asthma. ? A long-term disease that lowers your body's ability to fight infection (immunocompromised). ? Heart disease, including heart failure, a condition in which the arteries that lead to the heart become narrow or blocked (coronary artery disease), a disease which makes the heart muscle thick, weak, or stiff (cardiomyopathy). ? Diabetes. ? Chronic kidney disease. ? Sickle cell disease, a condition in which red blood cells have an abnormal "sickle" shape. ? Liver disease.  Are obese. What are the signs or symptoms? Symptoms of this condition can range from mild to severe. Symptoms may appear any time from 2 to 14 days after being exposed to the virus. They include:  A fever or chills.  A cough.  Difficulty breathing.  Headaches, body aches, or muscle aches.  Runny or stuffy (congested) nose.  A sore throat.  New loss of taste or smell. Some people may also have stomach problems, such as nausea, vomiting, or diarrhea. Other people may not have any symptoms of COVID-19. How is this diagnosed? This condition may be diagnosed based on:  Your signs and symptoms, especially if: ? You live in an area with a COVID-19 outbreak. ? You recently traveled to or from an area where the virus is common. ? You   provide care for or live with a person who was diagnosed with COVID-19. ? You were exposed to a person who was diagnosed with COVID-19.  A physical exam.  Lab tests, which may include: ? Taking a sample of fluid from the back of your nose and throat (nasopharyngeal fluid), your nose, or your throat using a swab. ? A sample of mucus from your lungs (sputum). ? Blood tests.  Imaging tests,  which may include, X-rays, CT scan, or ultrasound. How is this treated? At present, there is no medicine to treat COVID-19. Medicines that treat other diseases are being used on a trial basis to see if they are effective against COVID-19. Your health care provider will talk with you about ways to treat your symptoms. For most people, the infection is mild and can be managed at home with rest, fluids, and over-the-counter medicines. Treatment for a serious infection usually takes places in a hospital intensive care unit (ICU). It may include one or more of the following treatments. These treatments are given until your symptoms improve.  Receiving fluids and medicines through an IV.  Supplemental oxygen. Extra oxygen is given through a tube in the nose, a face mask, or a hood.  Positioning you to lie on your stomach (prone position). This makes it easier for oxygen to get into the lungs.  Continuous positive airway pressure (CPAP) or bi-level positive airway pressure (BPAP) machine. This treatment uses mild air pressure to keep the airways open. A tube that is connected to a motor delivers oxygen to the body.  Ventilator. This treatment moves air into and out of the lungs by using a tube that is placed in your windpipe.  Tracheostomy. This is a procedure to create a hole in the neck so that a breathing tube can be inserted.  Extracorporeal membrane oxygenation (ECMO). This procedure gives the lungs a chance to recover by taking over the functions of the heart and lungs. It supplies oxygen to the body and removes carbon dioxide. Follow these instructions at home: Lifestyle  If you are sick, stay home except to get medical care. Your health care provider will tell you how long to stay home. Call your health care provider before you go for medical care.  Rest at home as told by your health care provider.  Do not use any products that contain nicotine or tobacco, such as cigarettes,  e-cigarettes, and chewing tobacco. If you need help quitting, ask your health care provider.  Return to your normal activities as told by your health care provider. Ask your health care provider what activities are safe for you. General instructions  Take over-the-counter and prescription medicines only as told by your health care provider.  Drink enough fluid to keep your urine pale yellow.  Keep all follow-up visits as told by your health care provider. This is important. How is this prevented?  There is no vaccine to help prevent COVID-19 infection. However, there are steps you can take to protect yourself and others from this virus. To protect yourself:   Do not travel to areas where COVID-19 is a risk. The areas where COVID-19 is reported change often. To identify high-risk areas and travel restrictions, check the CDC travel website: wwwnc.cdc.gov/travel/notices  If you live in, or must travel to, an area where COVID-19 is a risk, take precautions to avoid infection. ? Stay away from people who are sick. ? Wash your hands often with soap and water for 20 seconds. If soap and water   are not available, use an alcohol-based hand sanitizer. ? Avoid touching your mouth, face, eyes, or nose. ? Avoid going out in public, follow guidance from your state and local health authorities. ? If you must go out in public, wear a cloth face covering or face mask. Make sure your mask covers your nose and mouth. ? Avoid crowded indoor spaces. Stay at least 6 feet (2 meters) away from others. ? Disinfect objects and surfaces that are frequently touched every day. This may include:  Counters and tables.  Doorknobs and light switches.  Sinks and faucets.  Electronics, such as phones, remote controls, keyboards, computers, and tablets. To protect others: If you have symptoms of COVID-19, take steps to prevent the virus from spreading to others.  If you think you have a COVID-19 infection, contact  your health care provider right away. Tell your health care team that you think you may have a COVID-19 infection.  Stay home. Leave your house only to seek medical care. Do not use public transport.  Do not travel while you are sick.  Wash your hands often with soap and water for 20 seconds. If soap and water are not available, use alcohol-based hand sanitizer.  Stay away from other members of your household. Let healthy household members care for children and pets, if possible. If you have to care for children or pets, wash your hands often and wear a mask. If possible, stay in your own room, separate from others. Use a different bathroom.  Make sure that all people in your household wash their hands well and often.  Cough or sneeze into a tissue or your sleeve or elbow. Do not cough or sneeze into your hand or into the air.  Wear a cloth face covering or face mask. Make sure your mask covers your nose and mouth. Where to find more information  Centers for Disease Control and Prevention: www.cdc.gov/coronavirus/2019-ncov/index.html  World Health Organization: www.who.int/health-topics/coronavirus Contact a health care provider if:  You live in or have traveled to an area where COVID-19 is a risk and you have symptoms of the infection.  You have had contact with someone who has COVID-19 and you have symptoms of the infection. Get help right away if:  You have trouble breathing.  You have pain or pressure in your chest.  You have confusion.  You have bluish lips and fingernails.  You have difficulty waking from sleep.  You have symptoms that get worse. These symptoms may represent a serious problem that is an emergency. Do not wait to see if the symptoms will go away. Get medical help right away. Call your local emergency services (911 in the [place] Do not drive yourself to the hospital. Let the emergency medical personnel know if you think you have  COVID-19. Summary  COVID-19 is a respiratory infection that is caused by a virus. It is also known as coronavirus disease or novel coronavirus. It can cause serious infections, such as pneumonia, acute respiratory distress syndrome, acute respiratory failure, or sepsis.  The virus that causes COVID-19 is contagious. This means that it can spread from person to person through droplets from breathing, speaking, singing, coughing, or sneezing.  You are more likely to develop a serious illness if you are 50 years of age or older, have a weak immune system, live in a nursing home, or have chronic disease.  There is no medicine to treat COVID-19. Your health care provider will talk with you about ways to treat your symptoms.    Take steps to protect yourself and others from infection. Wash your hands often and disinfect objects and surfaces that are frequently touched every day. Stay away from people who are sick and wear a mask if you are sick. This information is not intended to replace advice given to you by your health care provider. Make sure you discuss any questions you have with your health care provider. Document Revised: 09/28/2019 Document Reviewed: 01/04/2019 Elsevier Patient Education  2020 Elsevier Inc. What types of side effects do monoclonal antibody drugs cause?  Common side effects  In general, the more common side effects caused by monoclonal antibody drugs include: . Allergic reactions, such as hives or itching . Flu-like signs and symptoms, including chills, fatigue, fever, and muscle aches and pains . Nausea, vomiting . Diarrhea . Skin rashes . Low blood pressure   The CDC is recommending patients who receive monoclonal antibody treatments wait at least 90 days before being vaccinated.  Currently, there are no data on the safety and efficacy of mRNA COVID-19 vaccines in persons who received monoclonal antibodies or convalescent plasma as part of COVID-19 treatment. Based  on the estimated half-life of such therapies as well as evidence suggesting that reinfection is uncommon in the 90 days after initial infection, vaccination should be deferred for at least 90 days, as a precautionary measure until additional information becomes available, to avoid interference of the antibody treatment with vaccine-induced immune responses. 

## 2020-08-22 NOTE — Progress Notes (Signed)
  Diagnosis: COVID-19  Physician:dr wright   Procedure: [facility] Infusion [facility] Med: casirivimab\imdevimab infusion - Provided patient with casirivimab\imdevimab fact sheet for patients, parents and caregivers prior to infusion.  Complications: No immediate complications noted.  Discharge: Discharged home   Sim Boast 08/22/2020

## 2020-08-24 ENCOUNTER — Other Ambulatory Visit (HOSPITAL_COMMUNITY): Payer: Self-pay

## 2020-09-04 ENCOUNTER — Ambulatory Visit
Admission: RE | Admit: 2020-09-04 | Discharge: 2020-09-04 | Disposition: A | Discharge status: Home / Self Care | Payer: BC Managed Care – PPO | Source: Ambulatory Visit | Attending: Family Medicine | Admitting: Family Medicine

## 2020-09-04 ENCOUNTER — Ambulatory Visit: Payer: BC Managed Care – PPO | Admitting: Family Medicine

## 2020-09-04 ENCOUNTER — Other Ambulatory Visit: Payer: Self-pay

## 2020-09-04 VITALS — BP 140/80 | HR 102 | Temp 97.9°F | Ht 70.0 in

## 2020-09-04 DIAGNOSIS — R0602 Shortness of breath: Secondary | ICD-10-CM

## 2020-09-04 DIAGNOSIS — U071 COVID-19: Secondary | ICD-10-CM

## 2020-09-04 MED ORDER — AMOXICILLIN-POT CLAVULANATE 875-125 MG PO TABS: 1.0000 | ORAL_TABLET | Freq: Two times a day (BID) | ORAL | 0 refills | Status: DC

## 2020-09-04 MED ORDER — ZOLPIDEM TARTRATE 10 MG PO TABS: 10.0000 mg | ORAL_TABLET | Freq: Every evening | ORAL | 1 refills | Status: DC | PRN

## 2020-09-04 NOTE — Progress Notes (Signed)
Subjective:    Patient ID: Franklin Scott, male    DOB: 20-Feb-1971, 49 y.o.   MRN: [id number]  HPI Patient is a very pleasant 49 year old Caucasian male who presents today with shortness of breath.  He was diagnosed with Covid around September 5.  He receive Regeneron therapy.  He has improved.  However he continues to have a lingering discomfort on the left side of his chest.  He reports a fullness on that side of his chest.  He also reports a "smoker's cough".  On examination today I do not appreciate any rails or crackles or wheezes.  However he does have diminished breath sounds anteriorly and posteriorly on the left side of his chest concerning for a possible pleural effusion.  He also reports pain and pressure in his left maxillary sinus and also in his left frontal sinus.  He is audibly congested.  He reports thick rhinorrhea that is clear however it has a foul odor and taste.  He has a history of frequent sinus infections.  He has been using Xyzal and Flonase without any relief. Past Medical History:  Diagnosis Date  . Hypertension   . Internal hemorrhoids    No past surgical history on file. Current Outpatient Medications on File Prior to Visit  Medication Sig Dispense Refill  . amoxicillin (AMOXIL) 875 MG tablet Take 1 tablet (875 mg total) by mouth 2 (two) times daily. 20 tablet 0  . Aspirin-Salicylamide-Caffeine (BC HEADACHE POWDER PO) Take 2 packets by mouth daily as needed (headache).    . doxazosin (CARDURA) 1 MG tablet TAKE 1 TABLET BY MOUTH EVERY DAY 90 tablet 0  . fluticasone (FLONASE) 50 MCG/ACT nasal spray Place 2 sprays into both nostrils daily. 16 g 6  . ibuprofen (ADVIL) 600 MG tablet Take 1 tablet (600 mg total) by mouth every 6 (six) hours as needed. 30 tablet 0  . levocetirizine (XYZAL) 5 MG tablet Take 1 tablet (5 mg total) by mouth every evening. 30 tablet 3  . methocarbamol (ROBAXIN-750) 750 MG tablet Take 1 tablet (750 mg total) by mouth 4 (four) times daily. 30  tablet 0  . zolpidem (AMBIEN) 10 MG tablet Take 1 tablet (10 mg total) by mouth at bedtime as needed for sleep. 15 tablet 1   No current facility-administered medications on file prior to visit.   Allergies  Allergen Reactions  . Codeine Other (See Comments)    "floor moving"  . Sulfa Antibiotics Nausea Only   Social History   Socioeconomic History  . Marital status: Married    Spouse name: Not on file  . Number of children: 1  . Years of education: Not on file  . Highest education level: Not on file  Occupational History  . Occupation: [employer]  Tobacco Use  . Smoking status: Never Smoker  . Smokeless tobacco: Never Used  Vaping Use  . Vaping Use: Never used  Substance and Sexual Activity  . Alcohol use: No  . Drug use: No  . Sexual activity: Not on file  Other Topics Concern  . Not on file  Social History Narrative  . Not on file   Social Determinants of Health   Financial Resource Strain:   . Difficulty of Paying Living Expenses: Not on file  Food Insecurity:   . Worried About Charity fundraiser in the Last Year: Not on file  . Ran Out of Food in the Last Year: Not on file  Transportation Needs:   . Lack  of Transportation (Medical): Not on file  . Lack of Transportation (Non-Medical): Not on file  Physical Activity:   . Days of Exercise per Week: Not on file  . Minutes of Exercise per Session: Not on file  Stress:   . Feeling of Stress : Not on file  Social Connections:   . Frequency of Communication with Friends and Family: Not on file  . Frequency of Social Gatherings with Friends and Family: Not on file  . Attends Religious Services: Not on file  . Active Member of Clubs or Organizations: Not on file  . Attends Archivist Meetings: Not on file  . Marital Status: Not on file  Intimate Partner Violence:   . Fear of Current or Ex-Partner: Not on file  . Emotionally Abused: Not on file  . Physically Abused: Not on file  . Sexually Abused: Not on  file     Review of Systems  All other systems reviewed and are negative.      Objective:   Physical Exam Vitals reviewed.  Constitutional:      Appearance: Normal appearance. He is normal weight. He is not ill-appearing, toxic-appearing or diaphoretic.  HENT:     Right Ear: Tympanic membrane and ear canal normal.     Left Ear: Ear canal normal.     Nose: Congestion and rhinorrhea present.     Left Sinus: Maxillary sinus tenderness and frontal sinus tenderness present.  Cardiovascular:     Rate and Rhythm: Normal rate and regular rhythm.     Heart sounds: Normal heart sounds. No murmur heard.  No friction rub. No gallop.   Pulmonary:     Effort: Pulmonary effort is normal. No respiratory distress.     Breath sounds: Decreased air movement present. No stridor. Examination of the left-upper field reveals decreased breath sounds. Examination of the left-lower field reveals decreased breath sounds. Decreased breath sounds present. No rhonchi or rales.  Musculoskeletal:     Right lower leg: No edema.     Left lower leg: No edema.  Neurological:     Mental Status: He is alert.            . Assessment & Plan:  COVID-19 - Plan: CBC with Differential/Platelet, Brain natriuretic peptide, BASIC METABOLIC PANEL WITH GFR, D-dimer, quantitative (not at [facility]), DG Chest 2 View  Shortness of breath - Plan: CBC with Differential/Platelet, Brain natriuretic peptide, BASIC METABOLIC PANEL WITH GFR, D-dimer, quantitative (not at [facility]), DG Chest 2 View  I believe the patient has developed a secondary bacterial sinus infection after recent viral upper respiratory infection.  Treat the sinus infection with Augmentin 875 mg twice daily for 10 days.  Continue Flonase.  I believe he also likely had Covid pneumonia explaining his left-sided chest tenderness and his shortness of breath.  Obtain a D-dimer to rule out a pulmonary embolism given his recent Covid infection.  Also obtain a chest x-ray to  rule out a pleural effusion.  If lab work and chest x-ray are normal, recommended tincture of time.

## 2020-09-05 LAB — BASIC METABOLIC PANEL WITH GFR
BUN: 12 mg/dL (ref 7–25)
CO2: 28 mmol/L (ref 20–32)
Calcium: 10 mg/dL (ref 8.6–10.3)
Chloride: 106 mmol/L (ref 98–110)
Creat: 1.08 mg/dL (ref 0.70–1.33)
GFR, Est African American: 92 mL/min/{1.73_m2} (ref >=60)
GFR, Est Non African American: 79 mL/min/{1.73_m2} (ref >=60)
Glucose, Bld: 68 mg/dL (ref 65–99)
Potassium: 4.3 mmol/L (ref 3.5–5.3)
Sodium: 144 mmol/L (ref 135–146)

## 2020-09-05 LAB — CBC WITH DIFFERENTIAL/PLATELET
Absolute Monocytes: 556 cells/uL (ref 200–950)
Basophils Absolute: 7 cells/uL (ref 0–200)
Basophils Relative: 0.1 %
Eosinophils Absolute: 80 cells/uL (ref 15–500)
Eosinophils Relative: 1.2 %
HCT: 41.8 % (ref 38.5–50.0)
Hemoglobin: 14.1 g/dL (ref 13.2–17.1)
Lymphs Abs: 1702 cells/uL (ref 850–3900)
MCH: 28.8 pg (ref 27.0–33.0)
MCHC: 33.7 g/dL (ref 32.0–36.0)
MCV: 85.3 fL (ref 80.0–100.0)
MPV: 9.6 fL (ref 7.5–12.5)
Monocytes Relative: 8.3 %
Neutro Abs: 4355 cells/uL (ref 1500–7800)
Neutrophils Relative %: 65 %
Platelets: 412 10*3/uL — ABNORMAL HIGH (ref 140–400)
RBC: 4.9 10*6/uL (ref 4.20–5.80)
RDW: 13.1 % (ref 11.0–15.0)
Total Lymphocyte: 25.4 %
WBC: 6.7 10*3/uL (ref 3.8–10.8)

## 2020-09-05 LAB — BRAIN NATRIURETIC PEPTIDE: Brain Natriuretic Peptide: 25 pg/mL (ref <100)

## 2020-09-05 LAB — D-DIMER, QUANTITATIVE: D-Dimer, Quant: 0.25 mcg/mL FEU (ref <0.50)

## 2021-05-09 IMAGING — DX DG CHEST 2V
2 series · 2 of 2 positions shown · non-contrast
Comparison: None.

CLINICAL DATA: Shortness of breath.

EXAM:
CHEST - 2 VIEW

[dg chest 2 view (1 of 2)]
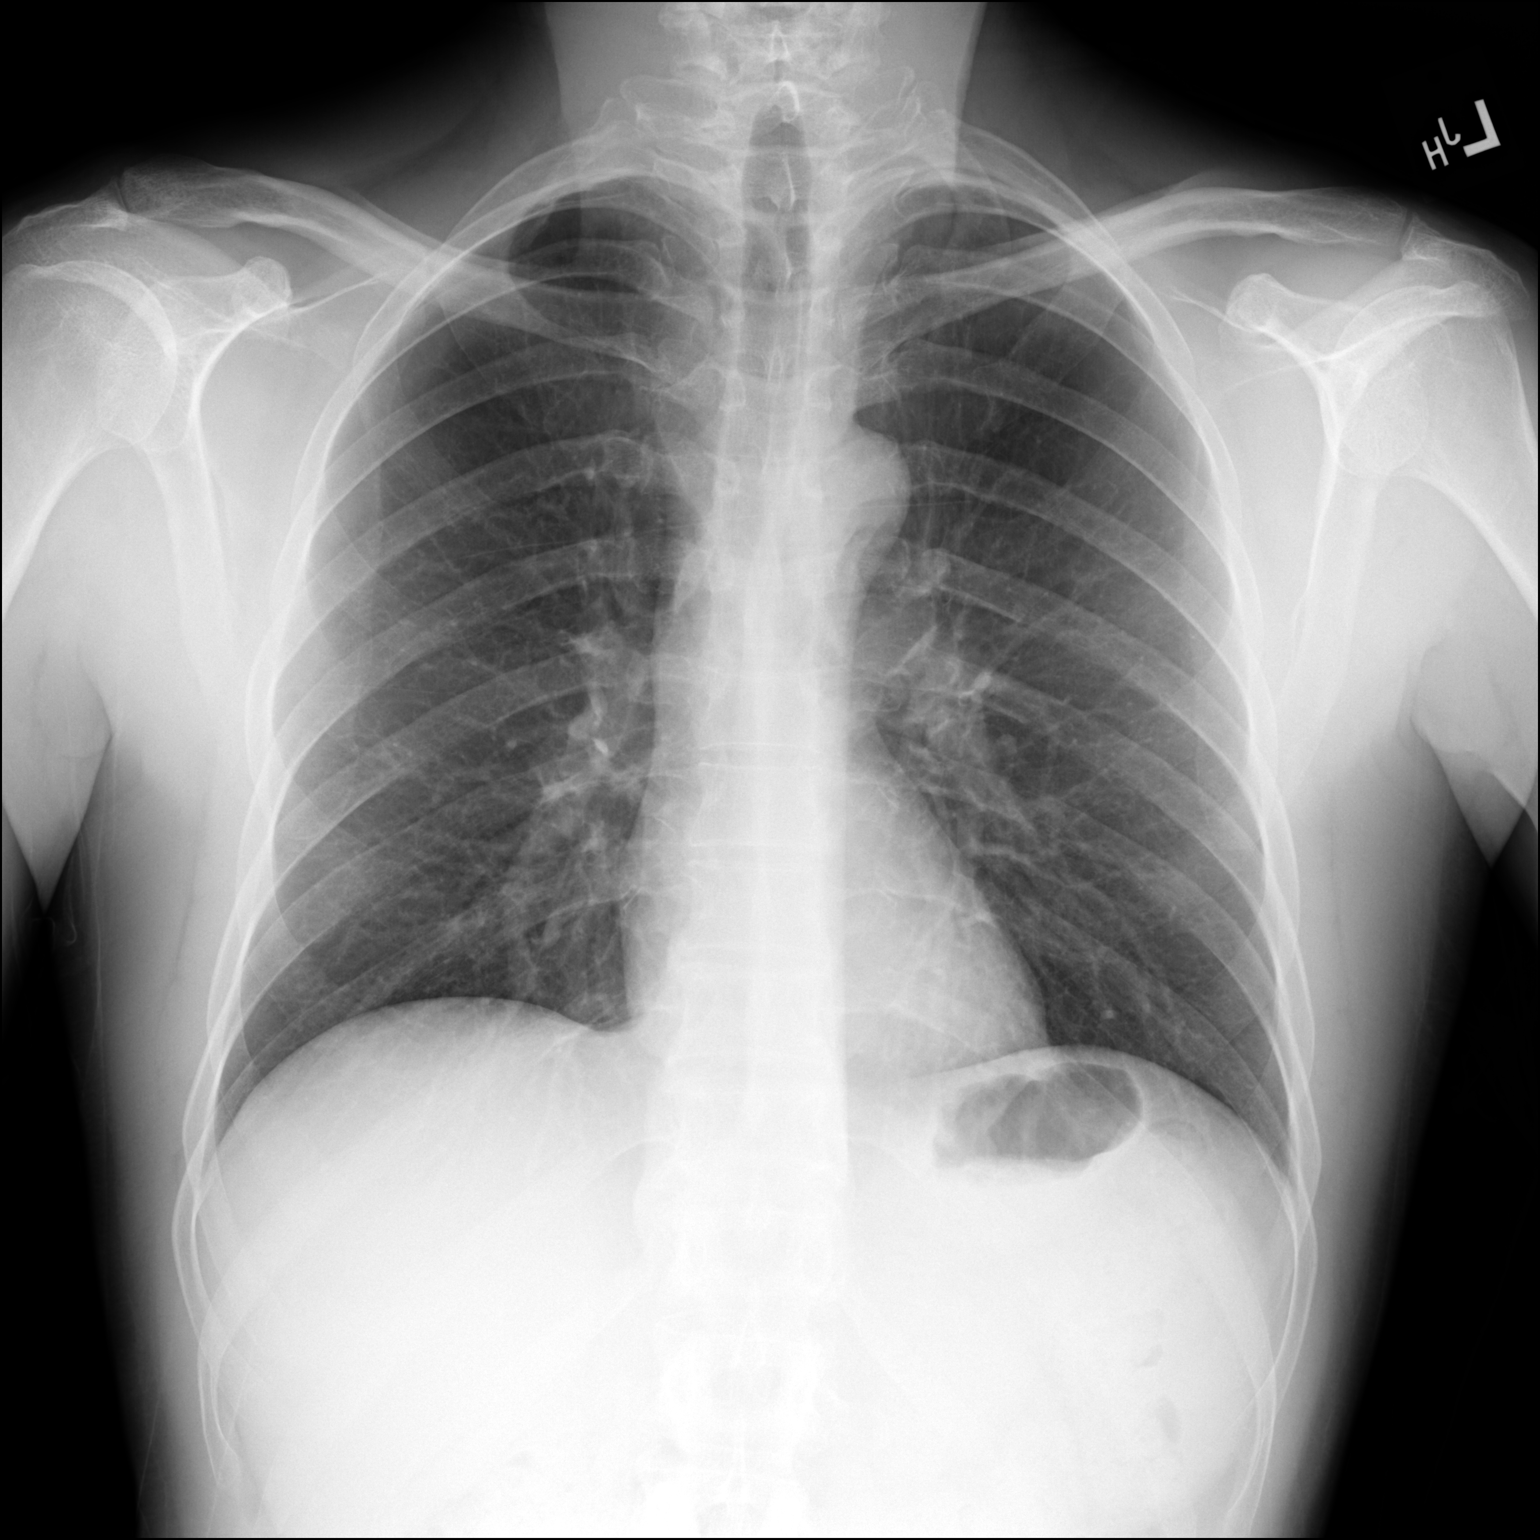

[dg chest 2 view (2 of 2)]
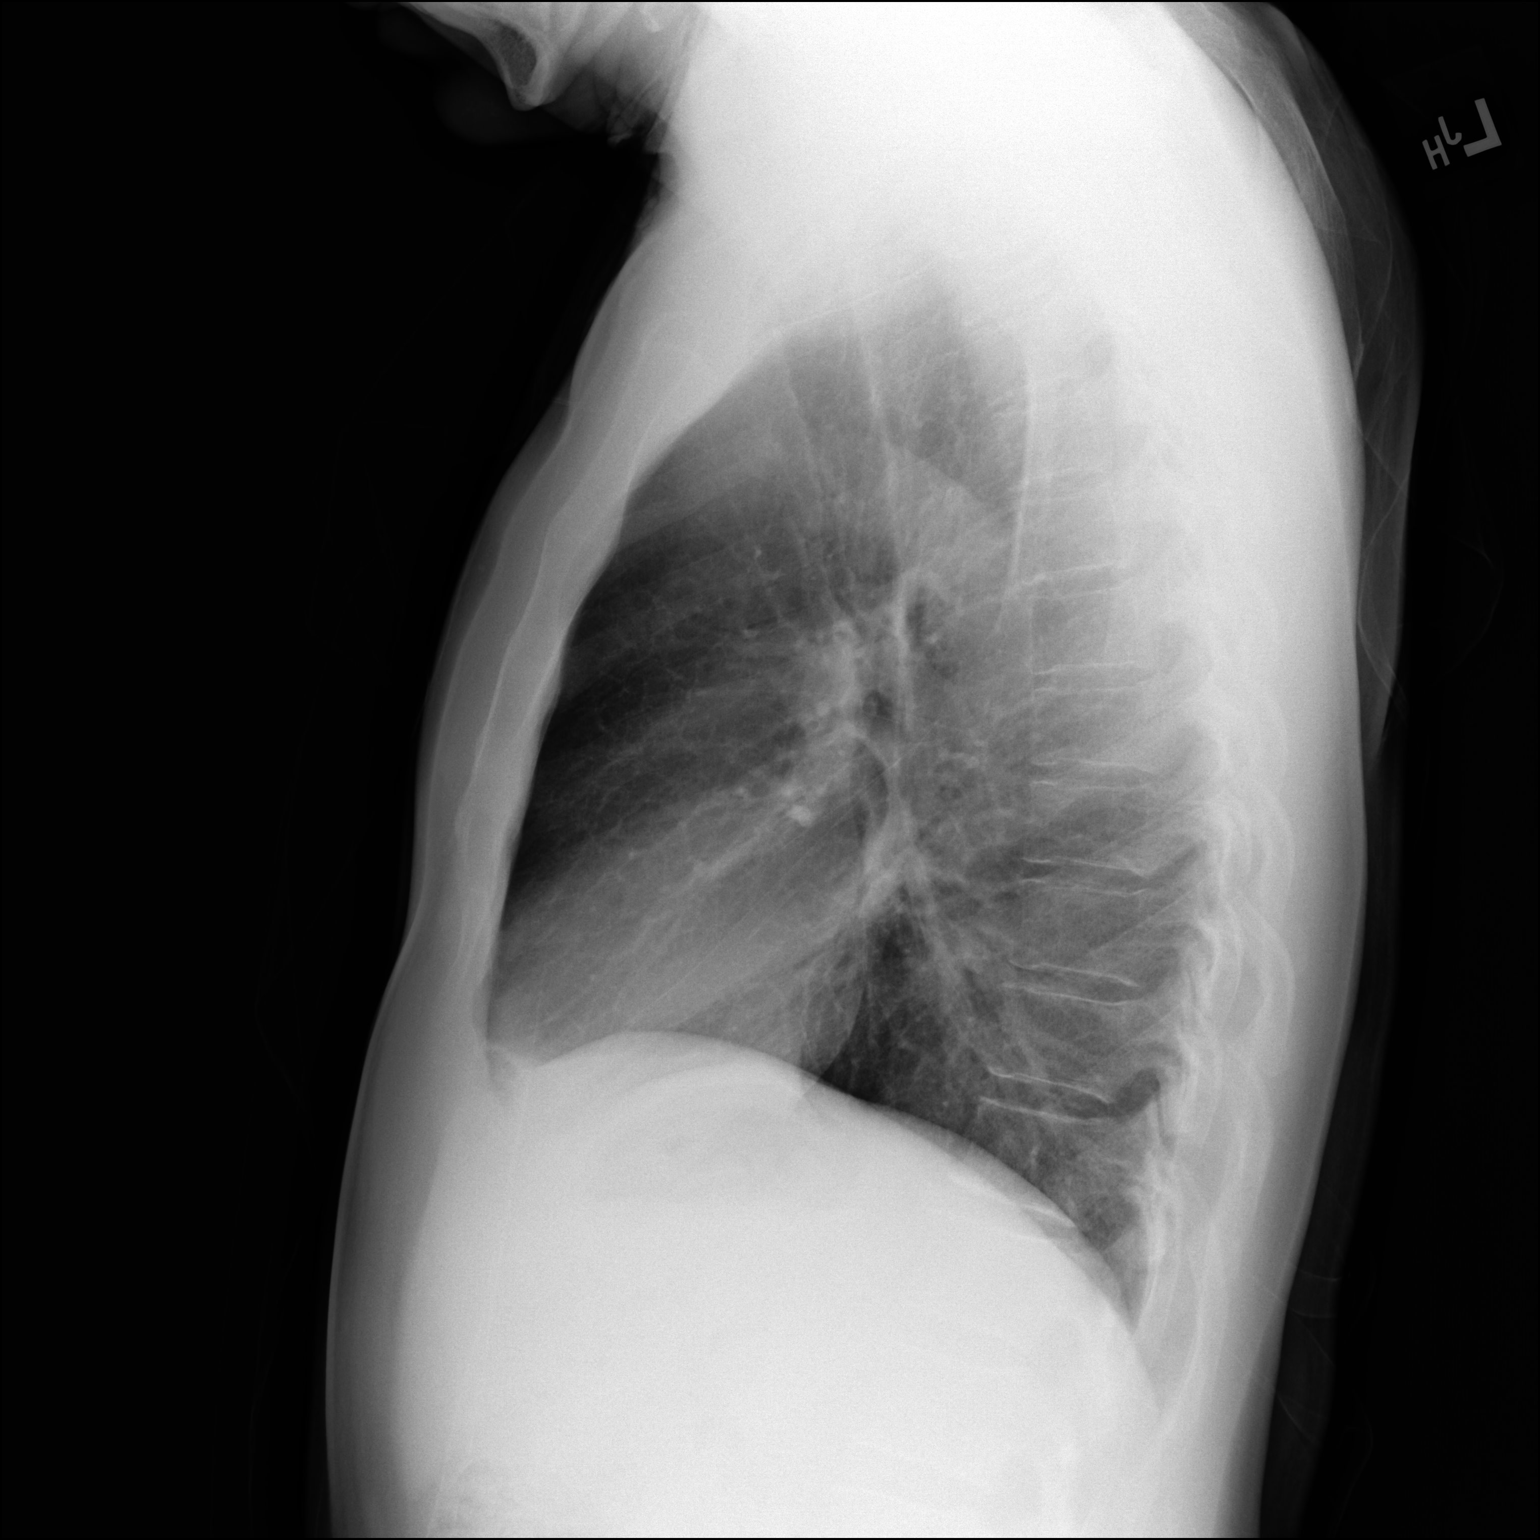

[2 of 2 positions shown; findings below may reference images not displayed]

FINDINGS: The heart size and mediastinal contours are within normal limits.
Both lungs are clear. No pneumothorax or pleural effusion is noted.
The visualized skeletal structures are unremarkable.
IMPRESSION: No active cardiopulmonary disease.

## 2021-07-17 ENCOUNTER — Other Ambulatory Visit: Payer: Self-pay | Admitting: Family Medicine

## 2021-07-17 NOTE — Telephone Encounter (Signed)
Ok to refill??  Last office visit/ refill 09/04/2020, #1 refills.

## 2021-08-12 ENCOUNTER — Encounter: Payer: Self-pay | Admitting: Gastroenterology

## 2021-08-21 ENCOUNTER — Ambulatory Visit: Payer: BC Managed Care – PPO | Admitting: Family Medicine

## 2021-11-20 ENCOUNTER — Telehealth (INDEPENDENT_AMBULATORY_CARE_PROVIDER_SITE_OTHER): Payer: BC Managed Care – PPO | Admitting: Nurse Practitioner

## 2021-11-20 ENCOUNTER — Encounter: Payer: Self-pay | Admitting: Nurse Practitioner

## 2021-11-20 ENCOUNTER — Other Ambulatory Visit: Payer: Self-pay

## 2021-11-20 DIAGNOSIS — J0110 Acute frontal sinusitis, unspecified: Secondary | ICD-10-CM

## 2021-11-20 MED ORDER — AMOXICILLIN-POT CLAVULANATE 875-125 MG PO TABS: 1.0000 | ORAL_TABLET | Freq: Two times a day (BID) | ORAL | 0 refills | Status: AC

## 2021-11-20 NOTE — Progress Notes (Signed)
Subjective:    Patient ID: Franklin Scott, male    DOB: 1971-09-25, 50 y.o.   MRN: [id number]  HPI: Franklin Scott is a 50 y.o. male presenting virtually for sinus infection.  Chief Complaint  Patient presents with   Sinusitis    UPPER RESPIRATORY TRACT INFECTION Onset: Monday started, better Tuesday/Wednesday and then suddenly bad today.  Fever: no Chills: yes Body aches: yes, minor Cough: yes; occasional Shortness of breath: no Wheezing: no Chest pain: no Chest tightness: no Chest congestion: no Nasal congestion: yes Runny nose: no Post nasal drip: yes Sneezing: yes Sore throat: yes Swollen glands: no Sinus pressure:  yes; frontal bilaterally Headache: yes Face pain: no Toothache: no Ear pain: no  Ear pressure: no  Eyes red/itching:no Eye drainage/crusting:  yes; watery   Nausea: no  Vomiting: no Diarrhea: no  Change in appetite:  no; eating normally    Loss of taste/smell: no  Rash: no Fatigue:  yes Sick contacts: no Strep contacts: no  Context: worse Recurrent sinusitis: no Treatments attempted: Tylenol cold, Sudafed, Mucinex Relief with OTC medications: yes  Allergies  Allergen Reactions   Codeine Other (See Comments)    "floor moving"   Sulfa Antibiotics Nausea Only    Outpatient Encounter Medications as of 11/20/2021  Medication Sig   amoxicillin-clavulanate (AUGMENTIN) 875-125 MG tablet Take 1 tablet by mouth 2 (two) times daily for 7 days.   zolpidem (AMBIEN) 10 MG tablet TAKE 1 TABLET BY MOUTH AT BEDTIME AS NEEDED FOR SLEEP.   [DISCONTINUED] amoxicillin (AMOXIL) 875 MG tablet Take 1 tablet (875 mg total) by mouth 2 (two) times daily. (Patient not taking: Reported on 09/04/2020)   [DISCONTINUED] amoxicillin-clavulanate (AUGMENTIN) 875-125 MG tablet Take 1 tablet by mouth 2 (two) times daily.   [DISCONTINUED] Aspirin-Salicylamide-Caffeine (BC HEADACHE POWDER PO) Take 2 packets by mouth daily as needed (headache).   [DISCONTINUED]  doxazosin (CARDURA) 1 MG tablet TAKE 1 TABLET BY MOUTH EVERY DAY   [DISCONTINUED] fluticasone (FLONASE) 50 MCG/ACT nasal spray Place 2 sprays into both nostrils daily.   [DISCONTINUED] ibuprofen (ADVIL) 600 MG tablet Take 1 tablet (600 mg total) by mouth every 6 (six) hours as needed.   [DISCONTINUED] levocetirizine (XYZAL) 5 MG tablet Take 1 tablet (5 mg total) by mouth every evening.   [DISCONTINUED] methocarbamol (ROBAXIN-750) 750 MG tablet Take 1 tablet (750 mg total) by mouth 4 (four) times daily.   No facility-administered encounter medications on file as of 11/20/2021.    There are no problems to display for this patient.   Past Medical History:  Diagnosis Date   Hypertension    Internal hemorrhoids     Relevant past medical, surgical, family and social history reviewed and updated as indicated. Interim medical history since our last visit reviewed.  Review of Systems Per HPI unless specifically indicated above     Objective:    There were no vitals taken for this visit.  Wt Readings from Last 3 Encounters:  06/19/19 146 lb (66.2 kg)  06/08/19 140 lb (63.5 kg)  04/20/19 148 lb (67.1 kg)    Physical Exam Physical examination unable to be performed due to lack of equipment.  Patient talking in complete sentences during telemedicine visit.     Assessment & Plan:  1. Acute non-recurrent frontal sinusitis Acute.  Given symptoms initially improved, then suddenly worsened, likely bacterial sinus infection.  Start Augmentin twice daily for 7 days.  Also discussed humidifier, nasal rinses with saline to help relieve symptoms.  Recommended stopping Sudafed to prevent rebound nasal congestion.  Okay to continue Mucinex, push fluids.  Follow-up with no improvement.  - amoxicillin-clavulanate (AUGMENTIN) 875-125 MG tablet; Take 1 tablet by mouth 2 (two) times daily for 7 days.  Dispense: 14 tablet; Refill: 0   At the end of the visit, the patient reported complaints of arm pain.   Unfortunately, I was not able to examine the arm because the video was not working.  I advised him to schedule an in person visit with [place] to discuss arm pain.   Follow up plan: Return if symptoms worsen or fail to improve.  This visit was completed via telephone due to the restrictions of the COVID-19 pandemic. All issues as above were discussed and addressed but no physical exam was performed. If it was felt that the patient should be evaluated in the office, they were directed there. The patient verbally consented to this visit. Patient was unable to complete an audio/visual visit due to Technical difficulties.  Patient able to connect to video visit but camera was not functioning and I could not see him although he could see me.  Location of the patient: home Location of the provider: home Those involved with this call:  Provider: Noemi Chapel, DNP, FNP-C CMA: n/a Front Desk/Registration: Santina Evans  Time spent on call:  12 minutes on the phone discussing health concerns. 15 minutes total spent in review of patient's record and preparation of their chart. I verified patient identity using two factors (patient name and date of birth). Patient consents verbally to being seen via telemedicine visit today.

## 2022-01-19 ENCOUNTER — Ambulatory Visit (INDEPENDENT_AMBULATORY_CARE_PROVIDER_SITE_OTHER): Payer: BC Managed Care – PPO | Admitting: Family Medicine

## 2022-01-19 ENCOUNTER — Encounter: Payer: Self-pay | Admitting: Family Medicine

## 2022-01-19 ENCOUNTER — Other Ambulatory Visit: Payer: Self-pay

## 2022-01-19 VITALS — BP 132/98 | HR 86 | Temp 98.4°F | Resp 18 | Ht 70.0 in | Wt 145.0 lb

## 2022-01-19 DIAGNOSIS — Z0000 Encounter for general adult medical examination without abnormal findings: Secondary | ICD-10-CM | POA: Diagnosis not present

## 2022-01-19 DIAGNOSIS — I10 Essential (primary) hypertension: Secondary | ICD-10-CM | POA: Diagnosis not present

## 2022-01-19 DIAGNOSIS — Z125 Encounter for screening for malignant neoplasm of prostate: Secondary | ICD-10-CM | POA: Diagnosis not present

## 2022-01-19 DIAGNOSIS — R300 Dysuria: Secondary | ICD-10-CM | POA: Diagnosis not present

## 2022-01-19 LAB — CBC WITH DIFFERENTIAL/PLATELET
Absolute Monocytes: 624 cells/uL (ref 200–950)
Basophils Absolute: 13 cells/uL (ref 0–200)
Basophils Relative: 0.2 %
Eosinophils Absolute: 72 cells/uL (ref 15–500)
Eosinophils Relative: 1.1 %
HCT: 46.3 % (ref 38.5–50.0)
Hemoglobin: 15.5 g/dL (ref 13.2–17.1)
Lymphs Abs: 1898 cells/uL (ref 850–3900)
MCH: 28.6 pg (ref 27.0–33.0)
MCHC: 33.5 g/dL (ref 32.0–36.0)
MCV: 85.4 fL (ref 80.0–100.0)
MPV: 10 fL (ref 7.5–12.5)
Monocytes Relative: 9.6 %
Neutro Abs: 3894 cells/uL (ref 1500–7800)
Neutrophils Relative %: 59.9 %
Platelets: 336 10*3/uL (ref 140–400)
RBC: 5.42 10*6/uL (ref 4.20–5.80)
RDW: 12.2 % (ref 11.0–15.0)
Total Lymphocyte: 29.2 %
WBC: 6.5 10*3/uL (ref 3.8–10.8)

## 2022-01-19 LAB — COMPLETE METABOLIC PANEL WITH GFR
AG Ratio: 2 (calc) (ref 1.0–2.5)
ALT: 19 U/L (ref 9–46)
AST: 23 U/L (ref 10–35)
Albumin: 4.9 g/dL (ref 3.6–5.1)
Alkaline phosphatase (APISO): 60 U/L (ref 35–144)
BUN: 16 mg/dL (ref 7–25)
CO2: 27 mmol/L (ref 20–32)
Calcium: 10.8 mg/dL — ABNORMAL HIGH (ref 8.6–10.3)
Chloride: 100 mmol/L (ref 98–110)
Creat: 1.05 mg/dL (ref 0.70–1.30)
Globulin: 2.4 g/dL (calc) (ref 1.9–3.7)
Glucose, Bld: 83 mg/dL (ref 65–99)
Potassium: 4.2 mmol/L (ref 3.5–5.3)
Sodium: 139 mmol/L (ref 135–146)
Total Bilirubin: 0.6 mg/dL (ref 0.2–1.2)
Total Protein: 7.3 g/dL (ref 6.1–8.1)
eGFR: 85 mL/min/{1.73_m2} (ref >=60)

## 2022-01-19 LAB — LIPID PANEL
Cholesterol: 219 mg/dL — ABNORMAL HIGH (ref <200)
HDL: 59 mg/dL (ref >=40)
LDL Cholesterol (Calc): 139 mg/dL (calc) — ABNORMAL HIGH
Non-HDL Cholesterol (Calc): 160 mg/dL (calc) — ABNORMAL HIGH (ref <130)
Total CHOL/HDL Ratio: 3.7 (calc) (ref <5.0)
Triglycerides: 98 mg/dL (ref <150)

## 2022-01-19 LAB — URINALYSIS, ROUTINE W REFLEX MICROSCOPIC
Bilirubin Urine: NEGATIVE
Glucose, UA: NEGATIVE
Hgb urine dipstick: NEGATIVE
Ketones, ur: NEGATIVE
Leukocytes,Ua: NEGATIVE
Nitrite: NEGATIVE
Protein, ur: NEGATIVE
Specific Gravity, Urine: 1.02 (ref 1.001–1.035)
pH: 7.5 (ref 5.0–8.0)

## 2022-01-19 LAB — PSA: PSA: 0.79 ng/mL (ref <=4.00)

## 2022-01-19 MED ORDER — CIPROFLOXACIN HCL 500 MG PO TABS: 500.0000 mg | ORAL_TABLET | Freq: Two times a day (BID) | ORAL | 0 refills | Status: AC

## 2022-01-19 MED ORDER — TAMSULOSIN HCL 0.4 MG PO CAPS: 0.4000 mg | ORAL_CAPSULE | Freq: Every day | ORAL | 0 refills | Status: DC

## 2022-01-19 NOTE — Progress Notes (Signed)
Subjective:    Patient ID: Franklin Scott, male    DOB: 04-Nov-1971, 51 y.o.   MRN: [id number]  HPI Patient is a very pleasant 51 year old Caucasian gentleman who presents today for complete physical exam.  He has a history of hypertension.  He is not consistently taking his hydrochlorothiazide.  He states that he simply forgets.  His blood pressure today is elevated at 132/98.  He is not checking his blood pressure at home.  His colonoscopy is up-to-date.  He does have a history of BPH.  In the past we tried Flomax but he had retrograde ejaculation and he decided to stop the medication.  He reports increased urinary urgency and frequency.  Over the last 2 weeks, he reports severe dysuria with urination.  He states that sometimes he feels like he has to urinate and nothing will come out.  Recently he could only urinate after he had a bowel movement is no relieving pressure or preventing him from urinating.  He denies any hematuria or pyuria or change in color.  However he has consistent dysuria.  He denies any exposure to gonorrhea or chlamydia.  He is in a stable monogamous relationship.  I suspect prostatitis. Past Medical History:  Diagnosis Date   Hypertension    Internal hemorrhoids    History reviewed. No pertinent surgical history. Current Outpatient Medications on File Prior to Visit  Medication Sig Dispense Refill   hydrochlorothiazide (HYDRODIURIL) 25 MG tablet Take 25 mg by mouth daily.     zolpidem (AMBIEN) 10 MG tablet TAKE 1 TABLET BY MOUTH AT BEDTIME AS NEEDED FOR SLEEP. 30 tablet 1   No current facility-administered medications on file prior to visit.   Allergies  Allergen Reactions   Codeine Other (See Comments)    "floor moving"   Sulfa Antibiotics Nausea Only   Social History   Socioeconomic History   Marital status: Married    Spouse name: Not on file   Number of children: 1   Years of education: Not on file   Highest education level: Not on file   Occupational History   Occupation: [employer]  Tobacco Use   Smoking status: Never   Smokeless tobacco: Never  Vaping Use   Vaping Use: Never used  Substance and Sexual Activity   Alcohol use: No   Drug use: No   Sexual activity: Not on file  Other Topics Concern   Not on file  Social History Narrative   Not on file   Social Determinants of Health   Financial Resource Strain: Not on file  Food Insecurity: Not on file  Transportation Needs: Not on file  Physical Activity: Not on file  Stress: Not on file  Social Connections: Not on file  Intimate Partner Violence: Not on file     Review of Systems  All other systems reviewed and are negative.     Objective:   Physical Exam Vitals reviewed.  Constitutional:      Appearance: Normal appearance. He is normal weight. He is not ill-appearing, toxic-appearing or diaphoretic.  HENT:     Right Ear: Tympanic membrane and ear canal normal. There is no impacted cerumen.     Left Ear: Ear canal normal. There is no impacted cerumen.     Nose: No congestion or rhinorrhea.     Left Sinus: Maxillary sinus tenderness and frontal sinus tenderness present.     Mouth/Throat:     Mouth: Mucous membranes are moist.     Pharynx: Oropharynx  is clear. No oropharyngeal exudate or posterior oropharyngeal erythema.  Eyes:     General:        Right eye: No discharge.        Left eye: No discharge.     Extraocular Movements: Extraocular movements intact.     Conjunctiva/sclera: Conjunctivae normal.     Pupils: Pupils are equal, round, and reactive to light.  Neck:     Vascular: No carotid bruit.  Cardiovascular:     Rate and Rhythm: Normal rate and regular rhythm.     Heart sounds: Normal heart sounds. No murmur heard.   No friction rub. No gallop.  Pulmonary:     Effort: Pulmonary effort is normal. No respiratory distress.     Breath sounds: Normal breath sounds. Decreased air movement present. No stridor. No rhonchi or rales.   Abdominal:     General: Abdomen is flat. Bowel sounds are normal. There is no distension.     Palpations: Abdomen is soft. There is no mass.     Tenderness: There is no abdominal tenderness. There is no guarding or rebound.     Hernia: No hernia is present.  Genitourinary:    Pubic Area: No rash.      Penis: Normal and circumcised. No phimosis, paraphimosis, hypospadias, erythema, tenderness, discharge, swelling or lesions.      Testes: Normal.  Musculoskeletal:     Cervical back: Normal range of motion and neck supple. No rigidity or tenderness.     Right lower leg: No edema.     Left lower leg: No edema.  Lymphadenopathy:     Cervical: No cervical adenopathy.  Skin:    General: Skin is warm and dry.     Coloration: Skin is not jaundiced or pale.     Findings: No bruising, erythema, lesion or rash.  Neurological:     General: No focal deficit present.     Mental Status: He is alert and oriented to person, place, and time. Mental status is at baseline.     Cranial Nerves: No cranial nerve deficit.     Sensory: No sensory deficit.     Motor: No weakness.     Coordination: Coordination normal.     Gait: Gait normal.     Deep Tendon Reflexes: Reflexes normal.           . Assessment & Plan:  General medical exam - Plan: CBC with Differential/Platelet, COMPLETE METABOLIC PANEL WITH GFR, Lipid panel  Prostate cancer screening - Plan: PSA  Benign essential HTN - Plan: CBC with Differential/Platelet, COMPLETE METABOLIC PANEL WITH GFR, Lipid panel  Dysuria - Plan: Urinalysis, Routine w reflex microscopic Patient's colonoscopy is up-to-date.  He is due for prostate cancer screening so we will check a PSA.  First regarding his blood pressure is high.  I encouraged him to be consistent taking his hydrochlorothiazide and report his blood pressures back to me in 2 weeks.  I would like to see his blood pressure less than 140/90.  Second I believe his urinary symptoms are due to  prostatitis coupled with BPH.  Urinalysis today is completely normal.  There is no blood, leukocyte esterase, or nitrates.    Also on the differential diagnosis would be overactive bladder.  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  Consider overactive bladder if symptoms persist

## 2022-01-21 ENCOUNTER — Other Ambulatory Visit: Payer: Self-pay

## 2022-01-21 ENCOUNTER — Other Ambulatory Visit: Payer: BC Managed Care – PPO

## 2022-01-21 MED ORDER — HYDROCHLOROTHIAZIDE 25 MG PO TABS: 25.0000 mg | ORAL_TABLET | Freq: Every day | ORAL | 3 refills | Status: DC

## 2022-01-21 NOTE — Progress Notes (Signed)
PTH added to labs

## 2022-01-21 NOTE — Addendum Note (Signed)
Addended by: Wadie Lessen on: 01/21/2022 09:28 AM   Modules accepted: Orders

## 2022-01-22 ENCOUNTER — Other Ambulatory Visit: Payer: BC Managed Care – PPO

## 2022-01-22 LAB — PTH, INTACT AND CALCIUM
Calcium: 9.6 mg/dL (ref 8.6–10.3)
PTH: 55 pg/mL (ref 16–77)

## 2022-07-12 ENCOUNTER — Telehealth: Payer: BC Managed Care – PPO | Admitting: Physician Assistant

## 2022-07-12 ENCOUNTER — Telehealth: Payer: Self-pay

## 2022-07-12 ENCOUNTER — Other Ambulatory Visit: Payer: Self-pay | Admitting: Family Medicine

## 2022-07-12 DIAGNOSIS — U071 COVID-19: Secondary | ICD-10-CM | POA: Diagnosis not present

## 2022-07-12 MED ORDER — NIRMATRELVIR/RITONAVIR (PAXLOVID)TABLET: 3.0000 | ORAL_TABLET | Freq: Two times a day (BID) | ORAL | 0 refills | Status: AC

## 2022-07-12 MED ORDER — NIRMATRELVIR/RITONAVIR (PAXLOVID)TABLET: 3.0000 | ORAL_TABLET | Freq: Two times a day (BID) | ORAL | 0 refills | Status: DC

## 2022-07-12 NOTE — Telephone Encounter (Signed)
Pt's wife called in stating that pt tested positive for Covid. Pt's wife states that pt has been experiencing all symptoms (fever,chills,cough) since since 7/29. Pt's wife wanted to know if pcp would send something in to pharmacy for this. Please advise.  Cb#: [phone]

## 2022-07-12 NOTE — Progress Notes (Signed)
Virtual Visit Consent   Franklin Scott, you are scheduled for a virtual visit with a [facility] provider today. Just as with appointments in the office, your consent must be obtained to participate. Your consent will be active for this visit and any virtual visit you may have with one of our providers in the next 365 days. If you have a MyChart account, a copy of this consent can be sent to you electronically.  As this is a virtual visit, video technology does not allow for your provider to perform a traditional examination. This may limit your provider's ability to fully assess your condition. If your provider identifies any concerns that need to be evaluated in person or the need to arrange testing (such as labs, EKG, etc.), we will make arrangements to do so. Although advances in technology are sophisticated, we cannot ensure that it will always work on either your end or our end. If the connection with a video visit is poor, the visit may have to be switched to a telephone visit. With either a video or telephone visit, we are not always able to ensure that we have a secure connection.  By engaging in this virtual visit, you consent to the provision of healthcare and authorize for your insurance to be billed (if applicable) for the services provided during this visit. Depending on your insurance coverage, you may receive a charge related to this service.  I need to obtain your verbal consent now. Are you willing to proceed with your visit today? JVEON POUND has provided verbal consent on 07/12/2022 for a virtual visit (video or telephone). Mar Daring, PA-C  Date: 07/12/2022 3:40 PM  Virtual Visit via Video Note   I, Mar Daring, connected with  Franklin Scott  ([id number], 09-Oct-1971) on 07/12/22 at  3:30 PM EDT by a video-enabled telemedicine application and verified that I am speaking with the correct person using two identifiers.  Location: Patient: Virtual Visit  Location Patient: Home Provider: Virtual Visit Location Provider: Home Office   I discussed the limitations of evaluation and management by telemedicine and the availability of in person appointments. The patient expressed understanding and agreed to proceed.    History of Present Illness: Franklin Scott is a 51 y.o. who identifies as a male who was assigned male at birth, and is being seen today for Covid 23.  HPI: URI  This is a new problem. The current episode started in the past 7 days (Tested positive for Covid 19 on 07/10/22; symptoms started on Saturday). The problem has been gradually worsening. The maximum temperature recorded prior to his arrival was 100.4 - 100.9 F. The fever has been present for 1 to 2 days. Associated symptoms include congestion, coughing, headaches, nausea, a sore throat (initially) and vomiting. Pertinent negatives include no diarrhea, ear pain, plugged ear sensation, rhinorrhea or sinus pain. Associated symptoms comments: Myalgias, fatigue. He has tried increased fluids and acetaminophen (mucinex) for the symptoms.      Problems: There are no problems to display for this patient.   Allergies:  Allergies  Allergen Reactions   Codeine Other (See Comments)    "floor moving"   Sulfa Antibiotics Nausea Only   Medications:  Current Outpatient Medications:    nirmatrelvir/ritonavir EUA (PAXLOVID) 20 x 150 MG & 10 x '100MG'$  TABS, Take 3 tablets by mouth 2 (two) times daily for 5 days. (Take nirmatrelvir 150 mg two tablets twice daily for 5 days and ritonavir 100 mg  one tablet twice daily for 5 days) Patient GFR is 85, Disp: 30 tablet, Rfl: 0   hydrochlorothiazide (HYDRODIURIL) 25 MG tablet, Take 1 tablet (25 mg total) by mouth daily., Disp: 90 tablet, Rfl: 3   tamsulosin (FLOMAX) 0.4 MG CAPS capsule, Take 1 capsule (0.4 mg total) by mouth daily., Disp: 30 capsule, Rfl: 0   zolpidem (AMBIEN) 10 MG tablet, TAKE 1 TABLET BY MOUTH AT BEDTIME AS NEEDED FOR SLEEP., Disp:  30 tablet, Rfl: 1  Observations/Objective: Patient is well-developed, well-nourished in no acute distress.  Resting comfortably at home.  Head is normocephalic, atraumatic.  No labored breathing.  Speech is clear and coherent with logical content.  Patient is alert and oriented at baseline.    Assessment and Plan: 1. COVID-19 - nirmatrelvir/ritonavir EUA (PAXLOVID) 20 x 150 MG & 10 x '100MG'$  TABS; Take 3 tablets by mouth 2 (two) times daily for 5 days. (Take nirmatrelvir 150 mg two tablets twice daily for 5 days and ritonavir 100 mg one tablet twice daily for 5 days) Patient GFR is 85  Dispense: 30 tablet; Refill: 0  - Continue OTC symptomatic management of choice - Will send OTC vitamins and supplement information through AVS - Paxlovid prescribed - Patient enrolled in MyChart symptom monitoring - Push fluids - Rest as needed - Discussed return precautions and when to seek in-person evaluation, sent via AVS as well   Follow Up Instructions: I discussed the assessment and treatment plan with the patient. The patient was provided an opportunity to ask questions and all were answered. The patient agreed with the plan and demonstrated an understanding of the instructions.  A copy of instructions were sent to the patient via MyChart unless otherwise noted below.    The patient was advised to call back or seek an in-person evaluation if the symptoms worsen or if the condition fails to improve as anticipated.  Time:  I spent 10 minutes with the patient via telehealth technology discussing the above problems/concerns.    Mar Daring, PA-C

## 2022-07-12 NOTE — Patient Instructions (Signed)
Franklin Scott, thank you for joining Mar Daring, PA-C for today's virtual visit.  While this provider is not your primary care provider (PCP), if your PCP is located in our provider database this encounter information will be shared with them immediately following your visit.  Consent: (Patient) Franklin Scott provided verbal consent for this virtual visit at the beginning of the encounter.  Current Medications:  Current Outpatient Medications:    nirmatrelvir/ritonavir EUA (PAXLOVID) 20 x 150 MG & 10 x '100MG'$  TABS, Take 3 tablets by mouth 2 (two) times daily for 5 days. (Take nirmatrelvir 150 mg two tablets twice daily for 5 days and ritonavir 100 mg one tablet twice daily for 5 days) Patient GFR is 85, Disp: 30 tablet, Rfl: 0   hydrochlorothiazide (HYDRODIURIL) 25 MG tablet, Take 1 tablet (25 mg total) by mouth daily., Disp: 90 tablet, Rfl: 3   tamsulosin (FLOMAX) 0.4 MG CAPS capsule, Take 1 capsule (0.4 mg total) by mouth daily., Disp: 30 capsule, Rfl: 0   zolpidem (AMBIEN) 10 MG tablet, TAKE 1 TABLET BY MOUTH AT BEDTIME AS NEEDED FOR SLEEP., Disp: 30 tablet, Rfl: 1   Medications ordered in this encounter:  Meds ordered this encounter  Medications   nirmatrelvir/ritonavir EUA (PAXLOVID) 20 x 150 MG & 10 x '100MG'$  TABS    Sig: Take 3 tablets by mouth 2 (two) times daily for 5 days. (Take nirmatrelvir 150 mg two tablets twice daily for 5 days and ritonavir 100 mg one tablet twice daily for 5 days) Patient GFR is 85    Dispense:  30 tablet    Refill:  0    Order Specific Question:   Supervising Provider    Answer:   Noemi Chapel [[id number]]     *If you need refills on other medications prior to your next appointment, please contact your pharmacy*  Follow-Up: Call back or seek an in-person evaluation if the symptoms worsen or if the condition fails to improve as anticipated.  Other Instructions COVID-19: Quarantine and Isolation Quarantine If you were exposed Quarantine and  stay away from others when you have been in close contact with someone who has COVID-19. Isolate If you are sick or test positive Isolate when you are sick or when you have COVID-19, even if you don't have symptoms. When to stay home Calculating quarantine The date of your exposure is considered day 0. Day 1 is the first full day after your last contact with a person who has had COVID-19. Stay home and away from other people for at least 5 days. Learn why CDC updated guidance for the general public. IF YOU were exposed to COVID-19 and are NOT  up to dateIF YOU were exposed to COVID-19 and are NOT on COVID-19 vaccinations Quarantine for at least 5 days Stay home Stay home and quarantine for at least 5 full days. Wear a well-fitting mask if you must be around others in your home. Do not travel. Get tested Even if you don't develop symptoms, get tested at least 5 days after you last had close contact with someone with COVID-19. After quarantine Watch for symptoms Watch for symptoms until 10 days after you last had close contact with someone with COVID-19. Avoid travel It is best to avoid travel until a full 10 days after you last had close contact with someone with COVID-19. If you develop symptoms Isolate immediately and get tested. Continue to stay home until you know the results. Wear a well-fitting mask around others.  Take precautions until day 10 Wear a well-fitting mask Wear a well-fitting mask for 10 full days any time you are around others inside your home or in public. Do not go to places where you are unable to wear a well-fitting mask. If you must travel during days 6-10, take precautions. Avoid being around people who are more likely to get very sick from COVID-19. IF YOU were exposed to COVID-19 and are  up to dateIF YOU were exposed to COVID-19 and are on COVID-19 vaccinations No quarantine You do not need to stay home unless you develop symptoms. Get tested Even if you  don't develop symptoms, get tested at least 5 days after you last had close contact with someone with COVID-19. Watch for symptoms Watch for symptoms until 10 days after you last had close contact with someone with COVID-19. If you develop symptoms Isolate immediately and get tested. Continue to stay home until you know the results. Wear a well-fitting mask around others. Take precautions until day 10 Wear a well-fitting mask Wear a well-fitting mask for 10 full days any time you are around others inside your home or in public. Do not go to places where you are unable to wear a well-fitting mask. Take precautions if traveling Avoid being around people who are more likely to get very sick from COVID-19. IF YOU were exposed to COVID-19 and had confirmed COVID-19 within the past 90 days (you tested positive using a viral test) No quarantine You do not need to stay home unless you develop symptoms. Watch for symptoms Watch for symptoms until 10 days after you last had close contact with someone with COVID-19. If you develop symptoms Isolate immediately and get tested. Continue to stay home until you know the results. Wear a well-fitting mask around others. Take precautions until day 10 Wear a well-fitting mask Wear a well-fitting mask for 10 full days any time you are around others inside your home or in public. Do not go to places where you are unable to wear a well-fitting mask. Take precautions if traveling Avoid being around people who are more likely to get very sick from COVID-19. Calculating isolation Day 0 is your first day of symptoms or a positive viral test. Day 1 is the first full day after your symptoms developed or your test specimen was collected. If you have COVID-19 or have symptoms, isolate for at least 5 days. IF YOU tested positive for COVID-19 or have symptoms, regardless of vaccination status Stay home for at least 5 days Stay home for 5 days and isolate from others in  your home. Wear a well-fitting mask if you must be around others in your home. Do not travel. Ending isolation if you had symptoms End isolation after 5 full days if you are fever-free for 24 hours (without the use of fever-reducing medication) and your symptoms are improving. Ending isolation if you did NOT have symptoms End isolation after at least 5 full days after your positive test. If you got very sick from COVID-19 or have a weakened immune system You should isolate for at least 10 days. Consult your doctor before ending isolation. Take precautions until day 10 Wear a well-fitting mask Wear a well-fitting mask for 10 full days any time you are around others inside your home or in public. Do not go to places where you are unable to wear a well-fitting mask. Do not travel Do not travel until a full 10 days after your symptoms started or the  date your positive test was taken if you had no symptoms. Avoid being around people who are more likely to get very sick from COVID-19. Definitions Exposure Contact with someone infected with SARS-CoV-2, the virus that causes COVID-19, in a way that increases the likelihood of getting infected with the virus. Close contact A close contact is someone who was less than 6 feet away from an infected person (laboratory-confirmed or a clinical diagnosis) for a cumulative total of 15 minutes or more over a 24-hour period. For example, three individual 5-minute exposures for a total of 15 minutes. People who are exposed to someone with COVID-19 after they completed at least 5 days of isolation are not considered close contacts. Julio Sicks is a strategy used to prevent transmission of COVID-19 by keeping people who have been in close contact with someone with COVID-19 apart from others. Who does not need to quarantine? If you had close contact with someone with COVID-19 and you are in one of the following groups, you do not need to quarantine. You  are up to date with your COVID-19 vaccines. You had confirmed COVID-19 within the last 90 days (meaning you tested positive using a viral test). If you are up to date with COVID-19 vaccines, you should wear a well-fitting mask around others for 10 days from the date of your last close contact with someone with COVID-19 (the date of last close contact is considered day 0). Get tested at least 5 days after you last had close contact with someone with COVID-19. If you test positive or develop COVID-19 symptoms, isolate from other people and follow recommendations in the Isolation section below. If you tested positive for COVID-19 with a viral test within the previous 90 days and subsequently recovered and remain without COVID-19 symptoms, you do not need to quarantine or get tested after close contact. You should wear a well-fitting mask around others for 10 days from the date of your last close contact with someone with COVID-19 (the date of last close contact is considered day 0). If you have COVID-19 symptoms, get tested and isolate from other people and follow recommendations in the Isolation section below. Who should quarantine? If you come into close contact with someone with COVID-19, you should quarantine if you are not up to date on COVID-19 vaccines. This includes people who are not vaccinated. What to do for quarantine Stay home and away from other people for at least 5 days (day 0 through day 5) after your last contact with a person who has COVID-19. The date of your exposure is considered day 0. Wear a well-fitting mask when around others at home, if possible. For 10 days after your last close contact with someone with COVID-19, watch for fever (100.66F or greater), cough, shortness of breath, or other COVID-19 symptoms. If you develop symptoms, get tested immediately and isolate until you receive your test results. If you test positive, follow isolation recommendations. If you do not develop  symptoms, get tested at least 5 days after you last had close contact with someone with COVID-19. If you test negative, you can leave your home, but continue to wear a well-fitting mask when around others at home and in public until 10 days after your last close contact with someone with COVID-19. If you test positive, you should isolate for at least 5 days from the date of your positive test (if you do not have symptoms). If you do develop COVID-19 symptoms, isolate for at least 5 days  from the date your symptoms began (the date the symptoms started is day 0). Follow recommendations in the isolation section below. If you are unable to get a test 5 days after last close contact with someone with COVID-19, you can leave your home after day 5 if you have been without COVID-19 symptoms throughout the 5-day period. Wear a well-fitting mask for 10 days after your date of last close contact when around others at home and in public. Avoid people who are have weakened immune systems or are more likely to get very sick from COVID-19, and nursing homes and other high-risk settings, until after at least 10 days. If possible, stay away from people you live with, especially people who are at higher risk for getting very sick from COVID-19, as well as others outside your home throughout the full 10 days after your last close contact with someone with COVID-19. If you are unable to quarantine, you should wear a well-fitting mask for 10 days when around others at home and in public. If you are unable to wear a mask when around others, you should continue to quarantine for 10 days. Avoid people who have weakened immune systems or are more likely to get very sick from COVID-19, and nursing homes and other high-risk settings, until after at least 10 days. See additional information about travel. Do not go to places where you are unable to wear a mask, such as restaurants and some gyms, and avoid eating around others at home  and at work until after 10 days after your last close contact with someone with COVID-19. After quarantine Watch for symptoms until 10 days after your last close contact with someone with COVID-19. If you have symptoms, isolate immediately and get tested. Quarantine in high-risk congregate settings In certain congregate settings that have high risk of secondary transmission (such as Systems analyst and detention facilities, homeless shelters, or cruise ships), CDC recommends a 10-day quarantine for residents, regardless of vaccination and booster status. During periods of critical staffing shortages, facilities may consider shortening the quarantine period for staff to ensure continuity of operations. Decisions to shorten quarantine in these settings should be made in consultation with state, local, tribal, or territorial health departments and should take into consideration the context and characteristics of the facility. CDC's setting-specific guidance provides additional recommendations for these settings. Isolation Isolation is used to separate people with confirmed or suspected COVID-19 from those without COVID-19. People who are in isolation should stay home until it's safe for them to be around others. At home, anyone sick or infected should separate from others, or wear a well-fitting mask when they need to be around others. People in isolation should stay in a specific "sick room" or area and use a separate bathroom if available. Everyone who has presumed or confirmed COVID-19 should stay home and isolate from other people for at least 5 full days (day 0 is the first day of symptoms or the date of the day of the positive viral test for asymptomatic persons). They should wear a mask when around others at home and in public for an additional 5 days. People who are confirmed to have COVID-19 or are showing symptoms of COVID-19 need to isolate regardless of their vaccination status. This includes: People  who have a positive viral test for COVID-19, regardless of whether or not they have symptoms. People with symptoms of COVID-19, including people who are awaiting test results or have not been tested. People with symptoms should isolate even  if they do not know if they have been in close contact with someone with COVID-19. What to do for isolation Monitor your symptoms. If you have an emergency warning sign (including trouble breathing), seek emergency medical care immediately. Stay in a separate room from other household members, if possible. Use a separate bathroom, if possible. Take steps to improve ventilation at home, if possible. Avoid contact with other members of the household and pets. Don't share personal household items, like cups, towels, and utensils. Wear a well-fitting mask when you need to be around other people. Learn more about what to do if you are sick and how to notify your contacts. Ending isolation for people who had COVID-19 and had symptoms If you had COVID-19 and had symptoms, isolate for at least 5 days. To calculate your 5-day isolation period, day 0 is your first day of symptoms. Day 1 is the first full day after your symptoms developed. You can leave isolation after 5 full days. You can end isolation after 5 full days if you are fever-free for 24 hours without the use of fever-reducing medication and your other symptoms have improved (Loss of taste and smell may persist for weeks or months after recovery and need not delay the end of isolation). You should continue to wear a well-fitting mask around others at home and in public for 5 additional days (day 6 through day 10) after the end of your 5-day isolation period. If you are unable to wear a mask when around others, you should continue to isolate for a full 10 days. Avoid people who have weakened immune systems or are more likely to get very sick from COVID-19, and nursing homes and other high-risk settings, until after  at least 10 days. If you continue to have fever or your other symptoms have not improved after 5 days of isolation, you should wait to end your isolation until you are fever-free for 24 hours without the use of fever-reducing medication and your other symptoms have improved. Continue to wear a well-fitting mask through day 10. Contact your healthcare provider if you have questions. See additional information about travel. Do not go to places where you are unable to wear a mask, such as restaurants and some gyms, and avoid eating around others at home and at work until a full 10 days after your first day of symptoms. If an individual has access to a test and wants to test, the best approach is to use an antigen test1 towards the end of the 5-day isolation period. Collect the test sample only if you are fever-free for 24 hours without the use of fever-reducing medication and your other symptoms have improved (loss of taste and smell may persist for weeks or months after recovery and need not delay the end of isolation). If your test result is positive, you should continue to isolate until day 10. If your test result is negative, you can end isolation, but continue to wear a well-fitting mask around others at home and in public until day 10. Follow additional recommendations for masking and avoiding travel as described above. 1As noted in the labeling for authorized over-the counter antigen tests: Negative results should be treated as presumptive. Negative results do not rule out SARS-CoV-2 infection and should not be used as the sole basis for treatment or patient management decisions, including infection control decisions. To improve results, antigen tests should be used twice over a three-day period with at least 24 hours and no more than  48 hours between tests. Note that these recommendations on ending isolation do not apply to people who are moderately ill or very sick from COVID-19 or have weakened immune  systems. See section below for recommendations for when to end isolation for these groups. Ending isolation for people who tested positive for COVID-19 but had no symptoms If you test positive for COVID-19 and never develop symptoms, isolate for at least 5 days. Day 0 is the day of your positive viral test (based on the date you were tested) and day 1 is the first full day after the specimen was collected for your positive test. You can leave isolation after 5 full days. If you continue to have no symptoms, you can end isolation after at least 5 days. You should continue to wear a well-fitting mask around others at home and in public until day 10 (day 6 through day 10). If you are unable to wear a mask when around others, you should continue to isolate for 10 days. Avoid people who have weakened immune systems or are more likely to get very sick from COVID-19, and nursing homes and other high-risk settings, until after at least 10 days. If you develop symptoms after testing positive, your 5-day isolation period should start over. Day 0 is your first day of symptoms. Follow the recommendations above for ending isolation for people who had COVID-19 and had symptoms. See additional information about travel. Do not go to places where you are unable to wear a mask, such as restaurants and some gyms, and avoid eating around others at home and at work until 10 days after the day of your positive test. If an individual has access to a test and wants to test, the best approach is to use an antigen test1 towards the end of the 5-day isolation period. If your test result is positive, you should continue to isolate until day 10. If your test result is positive, you can also choose to test daily and if your test result is negative, you can end isolation, but continue to wear a well-fitting mask around others at home and in public until day 10. Follow additional recommendations for masking and avoiding travel as described  above. 1As noted in the labeling for authorized over-the counter antigen tests: Negative results should be treated as presumptive. Negative results do not rule out SARS-CoV-2 infection and should not be used as the sole basis for treatment or patient management decisions, including infection control decisions. To improve results, antigen tests should be used twice over a three-day period with at least 24 hours and no more than 48 hours between tests. Ending isolation for people who were moderately or very sick from COVID-19 or have a weakened immune system People who are moderately ill from COVID-19 (experiencing symptoms that affect the lungs like shortness of breath or difficulty breathing) should isolate for 10 days and follow all other isolation precautions. To calculate your 10-day isolation period, day 0 is your first day of symptoms. Day 1 is the first full day after your symptoms developed. If you are unsure if your symptoms are moderate, talk to a healthcare provider for further guidance. People who are very sick from COVID-19 (this means people who were hospitalized or required intensive care or ventilation support) and people who have weakened immune systems might need to isolate at home longer. They may also require testing with a viral test to determine when they can be around others. CDC recommends an isolation period of at  least 10 and up to 20 days for people who were very sick from COVID-19 and for people with weakened immune systems. Consult with your healthcare provider about when you can resume being around other people. If you are unsure if your symptoms are severe or if you have a weakened immune system, talk to a healthcare provider for further guidance. People who have a weakened immune system should talk to their healthcare provider about the potential for reduced immune responses to COVID-19 vaccines and the need to continue to follow current prevention measures (including wearing a  well-fitting mask and avoiding crowds and poorly ventilated indoor spaces) to protect themselves against COVID-19 until advised otherwise by their healthcare provider. Close contacts of immunocompromised people--including household members--should also be encouraged to receive all recommended COVID-19 vaccine doses to help protect these people. Isolation in high-risk congregate settings In certain high-risk congregate settings that have high risk of secondary transmission and where it is not feasible to cohort people (such as Systems analyst and detention facilities, homeless shelters, and cruise ships), CDC recommends a 10-day isolation period for residents. During periods of critical staffing shortages, facilities may consider shortening the isolation period for staff to ensure continuity of operations. Decisions to shorten isolation in these settings should be made in consultation with state, local, tribal, or territorial health departments and should take into consideration the context and characteristics of the facility. CDC's setting-specific guidance provides additional recommendations for these settings. This CDC guidance is meant to supplement--not replace--any federal, state, local, territorial, or tribal health and safety laws, rules, and regulations. Recommendations for specific settings These recommendations do not apply to healthcare professionals. For guidance specific to these settings, see Healthcare professionals: Interim Guidance for Optician, dispensing with SARS-CoV-2 Infection or Exposure to SARS-CoV-2 Patients, residents, and visitors to healthcare settings: Interim Infection Prevention and Control Recommendations for Healthcare Personnel During the [place] 2019 (COVID-19) Pandemic Additional setting-specific guidance and recommendations are available. These recommendations on quarantine and isolation do apply to [place] settings. Additional guidance is available  here: Overview of COVID-19 Quarantine for K-12 Schools Travelers: Travel information and recommendations Congregate facilities and other settings: [employer] for community, work, and school settings Ongoing COVID-19 exposure FAQs I live with someone with COVID-19, but I cannot be separated from them. How do we manage quarantine in this situation? It is very important for people with COVID-19 to remain apart from other people, if possible, even if they are living together. If separation of the person with COVID-19 from others that they live with is not possible, the other people that they live with will have ongoing exposure, meaning they will be repeatedly exposed until that person is no longer able to spread the virus to other people. In this situation, there are precautions you can take to limit the spread of COVID-19: The person with COVID-19 and everyone they live with should wear a well-fitting mask inside the home. If possible, one person should care for the person with COVID-19 to limit the number of people who are in close contact with the infected person. Take steps to protect yourself and others to reduce transmission in the home: Quarantine if you are not up to date with your COVID-19 vaccines. Isolate if you are sick or tested positive for COVID-19, even if you don't have symptoms. Learn more about the public health recommendations for testing, mask use and quarantine of close contacts, like yourself, who have ongoing exposure. These recommendations differ depending on your vaccination status. What  should I do if I have ongoing exposure to COVID-19 from someone I live with? Recommendations for this situation depend on your vaccination status: If you are not up to date on COVID-19 vaccines and have ongoing exposure to COVID-19, you should: Begin quarantine immediately and continue to quarantine throughout the isolation period of the person with COVID-19. Continue to quarantine for an  additional 5 days starting the day after the end of isolation for the person with COVID-19. Get tested at least 5 days after the end of isolation of the infected person that lives with them. If you test negative, you can leave the home but should continue to wear a well-fitting mask when around others at home and in public until 10 days after the end of isolation for the person with COVID-19. Isolate immediately if you develop symptoms of COVID-19 or test positive. If you are up to date with COVID-19 vaccines and have ongoing exposure to COVID-19, you should: Get tested at least 5 days after your first exposure. A person with COVID-19 is considered infectious starting 2 days before they develop symptoms, or 2 days before the date of their positive test if they do not have symptoms. Get tested again at least 5 days after the end of isolation for the person with COVID-19. Wear a well-fitting mask when you are around the person with COVID-19, and do this throughout their isolation period. Wear a well-fitting mask around others for 10 days after the infected person's isolation period ends. Isolate immediately if you develop symptoms of COVID-19 or test positive. What should I do if multiple people I live with test positive for COVID-19 at different times? Recommendations for this situation depend on your vaccination status: If you are not up to date with your COVID-19 vaccines, you should: Quarantine throughout the isolation period of any infected person that you live with. Continue to quarantine until 5 days after the end of isolation date for the most recently infected person that lives with you. For example, if the last day of isolation of the person most recently infected with COVID-19 was June 30, the new 5-day quarantine period starts on July 1. Get tested at least 5 days after the end of isolation for the most recently infected person that lives with you. Wear a well-fitting mask when you are  around any person with COVID-19 while that person is in isolation. Wear a well-fitting mask when you are around other people until 10 days after your last close contact. Isolate immediately if you develop symptoms of COVID-19 or test positive. If you are up to date with your COVID-19 vaccines, you should: Get tested at least 5 days after your first exposure. A person with COVID-19 is considered infectious starting 2 days before they developed symptoms, or 2 days before the date of their positive test if they do not have symptoms. Get tested again at least 5 days after the end of isolation for the most recently infected person that lives with you. Wear a well-fitting mask when you are around any person with COVID-19 while that person is in isolation. Wear a well-fitting mask around others for 10 days after the end of isolation for the most recently infected person that lives with you. For example, if the last day of isolation for the person most recently infected with COVID-19 was June 30, the new 10-day period to wear a well-fitting mask indoors in public starts on July 1. Isolate immediately if you develop symptoms of COVID-19 or test  positive. I had COVID-19 and completed isolation. Do I have to quarantine or get tested if someone I live with gets COVID-19 shortly after I completed isolation? No. If you recently completed isolation and someone that lives with you tests positive for the virus that causes COVID-19 shortly after the end of your isolation period, you do not have to quarantine or get tested as long as you do not develop new symptoms. Once all of the people that live together have completed isolation or quarantine, refer to the guidance below for new exposures to COVID-19. If you had COVID-19 in the previous 90 days and then came into close contact with someone with COVID-19, you do not have to quarantine or get tested if you do not have symptoms. But you should: Wear a well-fitting mask  indoors in public for 10 days after your last close contact. Monitor for COVID-19 symptoms for 10 days from the date of your last close contact. Isolate immediately and get tested if symptoms develop. If more than 90 days have passed since your recovery from infection, follow CDC's recommendations for close contacts. These recommendations will differ depending on your vaccination status. 03/11/2021 Content source: [facility] for Immunization and Respiratory Diseases ([facility]), Division of Viral Diseases This information is not intended to replace advice given to you by your health care provider. Make sure you discuss any questions you have with your health care provider. Document Revised: 07/15/2021 Document Reviewed: 07/15/2021 Elsevier Patient Education  2022 [facility]; Ritonavir Tablets What is this medication? NIRMATRELVIR; RITONAVIR (NIR ma TREL vir; ri TOE na veer) treats mild to moderate COVID-19. It may help people who are at high risk of developing severe illness. This medication works by limiting the spread of the virus in your body. The FDA has allowed the emergency use of this medication. This medicine may be used for other purposes; ask your health care provider or pharmacist if you have questions. COMMON BRAND NAME(S): PAXLOVID What should I tell my care team before I take this medication? They need to know if you have any of these conditions: Any allergies Any serious illness Kidney disease Liver disease An unusual or allergic reaction to nirmatrelvir, ritonavir, other medications, foods, dyes, or preservatives Pregnant or trying to get pregnant Breast-feeding How should I use this medication? This product contains 2 different medications that are packaged together. For the standard dose, take 2 pink tablets of nirmatrelvir with 1 white tablet of ritonavir (3 tablets total) by mouth with water twice daily. Talk to your care team if you have kidney  disease. You may need a different dose. Swallow the tablets whole. You can take it with or without food. If it upsets your stomach, take it with food. Take all of this medication unless your care team tells you to stop it early. Keep taking it even if you think you are better. Talk to your care team about the use of this medication in children. While it may be prescribed for children as young as 12 years for selected conditions, precautions do apply. Overdosage: If you think you have taken too much of this medicine contact a poison control center or emergency room at once. NOTE: This medicine is only for you. Do not share this medicine with others. What if I miss a dose? If you miss a dose, take it as soon as you can unless it is more than 8 hours late. If it is more than 8 hours late, skip the missed  dose. Take the next dose at the normal time. Do not take extra or 2 doses at the same time to make up for the missed dose. What may interact with this medication? Do not take this medication with any of the following medications: Alfuzosin Certain medications for anxiety or sleep like midazolam, triazolam Certain medications for cancer like apalutamide, enzalutamide Certain medications for cholesterol like lovastatin, simvastatin Certain medications for irregular heart beat like amiodarone, dronedarone, flecainide, propafenone, quinidine Certain medications for pain like meperidine, piroxicam Certain medications for psychotic disorders like clozapine, lurasidone, pimozide Certain medications for seizures like carbamazepine, phenobarbital, phenytoin Colchicine Eletriptan Eplerenone Ergot alkaloids like dihydroergotamine, ergonovine, ergotamine, methylergonovine Finerenone Flibanserin Ivabradine Lomitapide Naloxegol Ranolazine Rifampin Sildenafil Silodosin [facility] John's Wort Tolvaptan Ubrogepant Voclosporin This medication may also interact with the following medications: Bedaquiline Birth  control pills Bosentan Certain antibiotics like erythromycin or clarithromycin Certain medications for blood pressure like amlodipine, diltiazem, felodipine, nicardipine, nifedipine Certain medications for cancer like abemaciclib, ceritinib, dasatinib, encorafenib, ibrutinib, ivosidenib, neratinib, nilotinib, venetoclax, vinblastine, vincristine Certain medications for cholesterol like atorvastatin, rosuvastatin Certain medications for depression like bupropion, trazodone Certain medications for fungal infections like isavuconazonium, itraconazole, ketoconazole, voriconazole Certain medications for hepatitis C like elbasvir; grazoprevir, dasabuvir; ombitasvir; paritaprevir; ritonavir, glecaprevir; pibrentasvir, sofosbuvir; velpatasvir; voxilaprevir Certain medications for HIV or AIDS Certain medications for irregular heartbeat like lidocaine Certain medications that treat or prevent blood clots like rivaroxaban, warfarin Digoxin Fentanyl Medications that lower your chance of fighting infection like cyclosporine, sirolimus, tacrolimus Methadone Quetiapine Rifabutin Salmeterol Steroid medications like betamethasone, budesonide, ciclesonide, dexamethasone, fluticasone, methylprednisolone, mometasone, triamcinolone This list may not describe all possible interactions. Give your health care provider a list of all the medicines, herbs, non-prescription drugs, or dietary supplements you use. Also tell them if you smoke, drink alcohol, or use illegal drugs. Some items may interact with your medicine. What should I watch for while using this medication? Your condition will be monitored carefully while you are receiving this medication. Visit your care team for regular checkups. Tell your care team if your symptoms do not start to get better or if they get worse. If you have untreated HIV infection, this medication may lead to some HIV medications not working as well in the future. Birth control may  not work properly while you are taking this medication. Talk to your care team about using an extra method of birth control. What side effects may I notice from receiving this medication? Side effects that you should report to your care team as soon as possible: Allergic reactions--skin rash, itching, hives, swelling of the face, lips, tongue, or throat Liver injury--right upper belly pain, loss of appetite, nausea, light-colored stool, dark yellow or brown urine, yellowing skin or eyes, unusual weakness or fatigue Redness, blistering, peeling, or loosening of the skin, including inside the mouth Side effects that usually do not require medical attention (report these to your care team if they continue or are bothersome): Change in taste Diarrhea General discomfort and fatigue Increase in blood pressure Muscle pain Nausea Stomach pain This list may not describe all possible side effects. Call your doctor for medical advice about side effects. You may report side effects to FDA at 1-800-FDA-1088. Where should I keep my medication? Keep out of the reach of children and pets. Store at room temperature between 20 and 25 degrees C (68 and 77 degrees F). Get rid of any unused medication after the expiration date. To get rid of medications that are no longer needed or have  expired: Take the medication to a medication take-back program. Check with your pharmacy or law enforcement to find a location. If you cannot return the medication, check the label or package insert to see if the medication should be thrown out in the garbage or flushed down the toilet. If you are not sure, ask your care team. If it is safe to put it in the trash, take the medication out of the container. Mix the medication with cat litter, dirt, coffee grounds, or other unwanted substance. Seal the mixture in a bag or container. Put it in the trash. NOTE: This sheet is a summary. It may not cover all possible information. If you  have questions about this medicine, talk to your doctor, pharmacist, or health care provider.  2023 Elsevier/Gold Standard (2021-09-11 00:00:00)    If you have been instructed to have an in-person evaluation today at a local Urgent Care facility, please use the link below. It will take you to a list of all of our available [facility], including address, phone number and hours of operation. Please do not delay care.  [facility]  If you or a family member do not have a primary care provider, use the link below to schedule a visit and establish care. When you choose a [facility] primary care physician or advanced practice provider, you gain a long-term partner in health. Find a Primary Care Provider  Learn more about [facility] in-office and virtual care options: [facility]

## 2022-07-23 ENCOUNTER — Ambulatory Visit (INDEPENDENT_AMBULATORY_CARE_PROVIDER_SITE_OTHER): Payer: BC Managed Care – PPO | Admitting: Family Medicine

## 2022-07-23 VITALS — BP 140/70 | HR 87 | Temp 98.1°F | Ht 70.0 in | Wt 149.0 lb

## 2022-07-23 DIAGNOSIS — R21 Rash and other nonspecific skin eruption: Secondary | ICD-10-CM

## 2022-07-23 MED ORDER — PREDNISONE 20 MG PO TABS: ORAL_TABLET | ORAL | 0 refills | Status: DC

## 2022-07-23 NOTE — Progress Notes (Signed)
Subjective:    Patient ID: Franklin Scott, male    DOB: [birth year], 51 y.o.   MRN: [id number]  HPI Patient broke out in an extremely itchy rash starting on Tuesday.  The rash consists of erythematous papules 2 to 3 mm in diameter.  They are all over his back and his torso.  There are some on his upper arms.  There are some on his upper gluteus.  They are extremely itchy.  He did have COVID 2 weeks ago but he is long since finished the Paxlovid.  He denies any recent new medications.  He has not eaten any new foods.  He does work outside and has been in tall grass recently Past Medical History:  Diagnosis Date   Hypertension    Internal hemorrhoids    No past surgical history on file. Current Outpatient Medications on File Prior to Visit  Medication Sig Dispense Refill   hydrochlorothiazide (HYDRODIURIL) 25 MG tablet Take 1 tablet (25 mg total) by mouth daily. 90 tablet 3   tamsulosin (FLOMAX) 0.4 MG CAPS capsule Take 1 capsule (0.4 mg total) by mouth daily. (Patient not taking: Reported on 07/23/2022) 30 capsule 0   zolpidem (AMBIEN) 10 MG tablet TAKE 1 TABLET BY MOUTH AT BEDTIME AS NEEDED FOR SLEEP. (Patient not taking: Reported on 07/23/2022) 30 tablet 1   No current facility-administered medications on file prior to visit.   Allergies  Allergen Reactions   Codeine Other (See Comments)    "floor moving"   Sulfa Antibiotics Nausea Only   Social History   Socioeconomic History   Marital status: Married    Spouse name: Not on file   Number of children: 1   Years of education: Not on file   Highest education level: Not on file  Occupational History   Occupation: [employer]  Tobacco Use   Smoking status: Never   Smokeless tobacco: Never  Vaping Use   Vaping Use: Never used  Substance and Sexual Activity   Alcohol use: No   Drug use: No   Sexual activity: Not on file  Other Topics Concern   Not on file  Social History Narrative   Not on file   Social Determinants of Health    Financial Resource Strain: Not on file  Food Insecurity: Not on file  Transportation Needs: Not on file  Physical Activity: Not on file  Stress: Not on file  Social Connections: Not on file  Intimate Partner Violence: Not on file     Review of Systems  All other systems reviewed and are negative.      Objective:   Physical Exam Vitals reviewed.  Constitutional:      Appearance: Normal appearance. He is normal weight. He is not ill-appearing, toxic-appearing or diaphoretic.  HENT:     Nose: No congestion or rhinorrhea.     Left Sinus: Maxillary sinus tenderness and frontal sinus tenderness present.     Mouth/Throat:     Mouth: Mucous membranes are moist.     Pharynx: Oropharynx is clear. No oropharyngeal exudate or posterior oropharyngeal erythema.  Neck:     Vascular: No carotid bruit.  Cardiovascular:     Rate and Rhythm: Normal rate and regular rhythm.     Heart sounds: Normal heart sounds. No murmur heard.    No friction rub. No gallop.  Pulmonary:     Effort: Pulmonary effort is normal. No respiratory distress.     Breath sounds: Normal breath sounds. No stridor. No wheezing,  rhonchi or rales.  Abdominal:     General: Abdomen is flat. Bowel sounds are normal. There is no distension.     Palpations: Abdomen is soft. There is no mass.     Tenderness: There is no abdominal tenderness. There is no guarding or rebound.     Hernia: No hernia is present.  Genitourinary:    Pubic Area: No rash.      Penis: Circumcised. No phimosis, paraphimosis, hypospadias, erythema, tenderness, discharge, swelling or lesions.   Musculoskeletal:     Cervical back: Normal range of motion and neck supple. No rigidity or tenderness.     Right lower leg: No edema.     Left lower leg: No edema.  Lymphadenopathy:     Cervical: No cervical adenopathy.  Skin:    General: Skin is warm and dry.     Coloration: Skin is not jaundiced or pale.     Findings: Rash present. No bruising, erythema  or lesion. Rash is papular. Rash is not crusting, macular, nodular, purpuric, pustular, scaling, urticarial or vesicular.  Neurological:     Mental Status: He is alert.            . Assessment & Plan:  Rash and nonspecific skin eruption Differential diagnosis includes viral exanthem although I believe it is too late to be due to COVID, allergic reaction although he has been on hydrochlorothiazide for years and he is not taking any other medications, or insect bites which I believe this could be.  I will start the patient on a prednisone taper pack.  Is possible that he got into chiggers or some type of nest of mosquitoes.  Hopefully the rash will subside with the prednisone.  He can take Benadryl 25 mg every 6 hours as needed.  Recheck immediately if worsening.  Genitalia and mouth are not involved.

## 2022-10-01 ENCOUNTER — Other Ambulatory Visit: Payer: Self-pay | Admitting: Family Medicine

## 2022-10-01 NOTE — Telephone Encounter (Signed)
Requested medication (s) are due for refill today: Yes  Requested medication (s) are on the active medication list: Yes  Last refill:  07/23/21  Future visit scheduled:No  Notes to clinic:  Unable to refill per protocol, cannot delegate.      Requested Prescriptions  Pending Prescriptions Disp Refills   zolpidem (AMBIEN) 10 MG tablet [Pharmacy Med Name: ZOLPIDEM TARTRATE 10 MG TABLET] 30 tablet     Sig: TAKE 1 TABLET BY MOUTH EVERY DAY AT BEDTIME AS NEEDED FOR SLEEP     Not Delegated - Psychiatry:  Anxiolytics/Hypnotics Failed - 10/01/2022  3:48 PM      Failed - This refill cannot be delegated      Failed - Urine Drug Screen completed in last 360 days      Failed - Valid encounter within last 6 months    Recent Outpatient Visits           8 months ago General medical exam   [place] Susy Frizzle, MD   10 months ago Acute non-recurrent frontal sinusitis   [place] Eulogio Bear, NP   2 years ago COVID-67   [place] Pickard, Cammie Mcgee, MD   2 years ago Fever in adult   [place], Cammie Mcgee, MD   3 years ago Encounter for staple removal   [place] Pickard, Cammie Mcgee, MD

## 2022-11-30 ENCOUNTER — Ambulatory Visit (INDEPENDENT_AMBULATORY_CARE_PROVIDER_SITE_OTHER): Payer: BC Managed Care – PPO | Admitting: Family Medicine

## 2022-11-30 VITALS — BP 118/72 | HR 103 | Temp 99.4°F | Ht 70.0 in | Wt 146.0 lb

## 2022-11-30 DIAGNOSIS — R509 Fever, unspecified: Secondary | ICD-10-CM

## 2022-11-30 LAB — INFLUENZA A AND B AG, IMMUNOASSAY
INFLUENZA A ANTIGEN: NOT DETECTED
INFLUENZA B ANTIGEN: NOT DETECTED

## 2022-11-30 MED ORDER — NIRMATRELVIR/RITONAVIR (PAXLOVID)TABLET: 3.0000 | ORAL_TABLET | Freq: Two times a day (BID) | ORAL | 0 refills | Status: DC

## 2022-11-30 NOTE — Progress Notes (Signed)
Subjective:    Patient ID: Franklin Scott, male    DOB: 02-Aug-1971, 51 y.o.   MRN: [id number]  HPI Patient presents today with symptoms consistent with a viral syndrome.  He has been around several people over the last few days who have had a flulike illness.  In fact he took a friend to an urgent care on Friday night for the "flu".  Starting Sunday he developed diffuse body aches, and upset stomach, nausea and, cough that is nonproductive, fever or chills myalgias and headache.  Today he reports fatigue, diffuse myalgias, a nonproductive cough, and a low-grade fever. Past Medical History:  Diagnosis Date  . Hypertension   . Internal hemorrhoids    No past surgical history on file. Current Outpatient Medications on File Prior to Visit  Medication Sig Dispense Refill  . hydrochlorothiazide (HYDRODIURIL) 25 MG tablet Take 1 tablet (25 mg total) by mouth daily. 90 tablet 3  . predniSONE (DELTASONE) 20 MG tablet 3 tabs poqday 1-2, 2 tabs poqday 3-4, 1 tab poqday 5-6 12 tablet 0  . tamsulosin (FLOMAX) 0.4 MG CAPS capsule Take 1 capsule (0.4 mg total) by mouth daily. (Patient not taking: Reported on 07/23/2022) 30 capsule 0  . zolpidem (AMBIEN) 10 MG tablet TAKE 1 TABLET BY MOUTH EVERY DAY AT BEDTIME AS NEEDED FOR SLEEP 30 tablet 2   No current facility-administered medications on file prior to visit.   Allergies  Allergen Reactions  . Codeine Other (See Comments)    "floor moving"  . Sulfa Antibiotics Nausea Only   Social History   Socioeconomic History  . Marital status: Married    Spouse name: Not on file  . Number of children: 1  . Years of education: Not on file  . Highest education level: Not on file  Occupational History  . Occupation: [employer]  Tobacco Use  . Smoking status: Never  . Smokeless tobacco: Never  Vaping Use  . Vaping Use: Never used  Substance and Sexual Activity  . Alcohol use: No  . Drug use: No  . Sexual activity: Not on file  Other Topics Concern  . Not  on file  Social History Narrative  . Not on file   Social Determinants of Health   Financial Resource Strain: Not on file  Food Insecurity: Not on file  Transportation Needs: Not on file  Physical Activity: Not on file  Stress: Not on file  Social Connections: Not on file  Intimate Partner Violence: Not on file     Review of Systems  All other systems reviewed and are negative.      Objective:   Physical Exam Vitals reviewed.  Constitutional:      Appearance: Normal appearance. He is normal weight. He is not ill-appearing, toxic-appearing or diaphoretic.  HENT:     Right Ear: Tympanic membrane and ear canal normal.     Left Ear: Tympanic membrane and ear canal normal.     Nose: Congestion present. No rhinorrhea.     Left Sinus: Maxillary sinus tenderness and frontal sinus tenderness present.     Mouth/Throat:     Mouth: Mucous membranes are moist.     Pharynx: Oropharynx is clear. Posterior oropharyngeal erythema present. No oropharyngeal exudate.  Eyes:     Conjunctiva/sclera: Conjunctivae normal.  Neck:     Vascular: No carotid bruit.  Cardiovascular:     Rate and Rhythm: Regular rhythm. Tachycardia present.     Heart sounds: Normal heart sounds. No murmur heard.  No friction rub. No gallop.  Pulmonary:     Effort: Pulmonary effort is normal. No respiratory distress.     Breath sounds: Normal breath sounds. No stridor. No wheezing, rhonchi or rales.  Abdominal:     General: Abdomen is flat. Bowel sounds are normal. There is no distension.     Palpations: Abdomen is soft. There is no mass.     Tenderness: There is no abdominal tenderness. There is no guarding or rebound.     Hernia: No hernia is present.  Genitourinary:    Pubic Area: No rash.      Penis: Circumcised. No phimosis, paraphimosis, hypospadias, erythema, tenderness, discharge, swelling or lesions.   Musculoskeletal:     Cervical back: Normal range of motion and neck supple. No rigidity or  tenderness.     Right lower leg: No edema.     Left lower leg: No edema.  Lymphadenopathy:     Cervical: No cervical adenopathy.  Skin:    General: Skin is warm and dry.     Coloration: Skin is not jaundiced or pale.     Findings: No bruising, erythema, lesion or rash.  Neurological:     General: No focal deficit present.     Mental Status: He is alert.           . Assessment & Plan:  Fever, unspecified fever cause - Plan: Influenza A and B Ag, Immunoassay Patient's history and physical exam support a viral upper respiratory infection most likely influenza versus COVID.  I performed a flu test today in the office which was negative.  I suspect COVID.  I have asked the patient to go home and take a rapid COVID test.  If he test positive for COVID I went ahead and sent in Paxlovid 3 tablets twice daily for 5 days.  If he test negative, the patient needs to treat it symptomatically.  I would recommend ibuprofen 800 mg every 8 hours for body aches and fever, Mucinex DM for cough and congestion and tincture of time.  Anticipate 7 days from now the patient should start to feel back to normal

## 2022-12-03 ENCOUNTER — Telehealth: Payer: Self-pay | Admitting: Family Medicine

## 2022-12-03 ENCOUNTER — Other Ambulatory Visit: Payer: Self-pay | Admitting: Family Medicine

## 2022-12-03 MED ORDER — AZITHROMYCIN 250 MG PO TABS: ORAL_TABLET | ORAL | 0 refills | Status: DC

## 2022-12-03 NOTE — Telephone Encounter (Signed)
Patient has been taking Mucinex and Ibuprofen as prescribed. Sx have worsened; greenish mucous. Patient took an at-home COVID test on Tuesday, 11/30/22 and it was negative. Fever broke yesterday. Sx: body aches, extreme fatigue, congestion, cough.  Requesting different medication(s) for management of sx.  Pharmacy confirmed as   [id number] [place] [id number]: [phone] Fax: [phone] #: [id number]  Please advise at [phone]

## 2023-03-13 ENCOUNTER — Encounter (HOSPITAL_COMMUNITY): Payer: Self-pay

## 2023-03-13 ENCOUNTER — Ambulatory Visit (HOSPITAL_COMMUNITY)
Admission: EM | Admit: 2023-03-13 | Discharge: 2023-03-13 | Disposition: A | Discharge status: Home / Self Care | Payer: BC Managed Care – PPO

## 2023-03-13 DIAGNOSIS — R0981 Nasal congestion: Secondary | ICD-10-CM

## 2023-03-13 DIAGNOSIS — R030 Elevated blood-pressure reading, without diagnosis of hypertension: Secondary | ICD-10-CM | POA: Diagnosis not present

## 2023-03-13 NOTE — ED Provider Notes (Signed)
[place]    CSN: [id number] Arrival date & time: 03/13/23  1110      History   Chief Complaint Chief Complaint  Patient presents with   Cough    HPI Franklin Scott is a 52 y.o. male.   52 year old male, Franklin Scott, presents to urgent care with 1 day history of cleaning out a shed and feels a lot of nasal congestion, stopped up, blowing green discharge numbness.  Taking over-the-counter meds(DayQuil NyQuil, without relief).  Patient states he is coughing as well afterwards.  Patient requesting antibiotics for "sinus infection"  The history is provided by the patient. No language interpreter was used.    Past Medical History:  Diagnosis Date   Hypertension    Internal hemorrhoids     Patient Active Problem List   Diagnosis Date Noted   Congestion of nasal sinus 03/13/2023   Elevated blood pressure reading 03/13/2023    History reviewed. No pertinent surgical history.     Home Medications    Prior to Admission medications   Medication Sig Start Date End Date Taking? Authorizing Provider  azithromycin (ZITHROMAX) 250 MG tablet 2 tabs poqday1, 1 tab poqday 2-5 12/03/22  Yes Pickard, Cammie Mcgee, MD  hydrochlorothiazide (HYDRODIURIL) 25 MG tablet Take 1 tablet (25 mg total) by mouth daily. 01/21/22  Yes Susy Frizzle, MD  ketorolac (TORADOL) 60 MG/2ML SOLN injection Inject 60 mg into the muscle once. 12/24/22  Yes [provider]  predniSONE (DELTASONE) 20 MG tablet 3 tabs poqday 1-2, 2 tabs poqday 3-4, 1 tab poqday 5-6 07/23/22  Yes Pickard, Cammie Mcgee, MD  triamcinolone acetonide (KENALOG-40) 40 MG/ML injection Inject 2 mLs into the muscle once. 12/24/22  Yes [provider]  zolpidem (AMBIEN) 10 MG tablet TAKE 1 TABLET BY MOUTH EVERY DAY AT BEDTIME AS NEEDED FOR SLEEP 10/04/22  Yes Pickard, Cammie Mcgee, MD    Family History Family History  Problem Relation Age of Onset   Alzheimer's disease Mother    Colon polyps Mother    Prostate  cancer Father        mets   Colon polyps Father    Crohn's disease Father    Colon cancer Neg Hx     Social History Social History   Tobacco Use   Smoking status: Never   Smokeless tobacco: Never  Vaping Use   Vaping Use: Never used  Substance Use Topics   Alcohol use: No   Drug use: No     Allergies   Codeine and Sulfa antibiotics   Review of Systems Review of Systems  Constitutional:  Negative for fever.  HENT:  Positive for congestion, sinus pressure and sinus pain.   Respiratory:  Positive for cough. Negative for chest tightness, shortness of breath, wheezing and stridor.   Cardiovascular:  Negative for chest pain and palpitations.  All other systems reviewed and are negative.    Physical Exam Triage Vital Signs ED Triage Vitals [03/13/23 1230]  Enc Vitals Group     BP      Pulse      Resp      Temp      Temp src      SpO2      Weight 140 lb (63.5 kg)     Height 5\' 8"  (1.727 m)     Head Circumference      Peak Flow      Pain Score 0     Pain Loc  Pain Edu?      Excl. in [place]?    No data found.  Updated Vital Signs BP (!) 151/90 (BP Location: Left Arm)   Pulse 72   Temp 98.7 F (37.1 C) (Oral)   Resp 20   Ht 5\' 8"  (1.727 m)   Wt 140 lb (63.5 kg)   SpO2 98%   BMI 21.29 kg/m   Visual Acuity Right Eye Distance:   Left Eye Distance:   Bilateral Distance:    Right Eye Near:   Left Eye Near:    Bilateral Near:     Physical Exam Vitals and nursing note reviewed.  Constitutional:      General: He is not in acute distress.    Appearance: He is well-developed and well-groomed.  HENT:     Head: Normocephalic and atraumatic.     Right Ear: Tympanic membrane is retracted.     Left Ear: Tympanic membrane is retracted.     Nose: Mucosal edema and congestion present.     Mouth/Throat:     Lips: Pink.     Mouth: Mucous membranes are moist.     Pharynx: Oropharynx is clear.  Eyes:     General: Lids are normal.     Extraocular Movements:  Extraocular movements intact.     Conjunctiva/sclera: Conjunctivae normal.     Pupils: Pupils are equal, round, and reactive to light.  Neck:     Trachea: Trachea normal.  Cardiovascular:     Rate and Rhythm: Normal rate and regular rhythm.     Pulses: Normal pulses.     Heart sounds: Normal heart sounds. No murmur heard. Pulmonary:     Effort: Pulmonary effort is normal. No respiratory distress.     Breath sounds: Normal breath sounds and air entry.     Comments: O2 sat is 90% on room air, respirations 20, no wheezing, no stridor Abdominal:     Palpations: Abdomen is soft.     Tenderness: There is no abdominal tenderness.  Musculoskeletal:        General: No swelling.     Cervical back: Normal range of motion and neck supple.  Skin:    General: Skin is warm and dry.     Capillary Refill: Capillary refill takes less than 2 seconds.  Neurological:     General: No focal deficit present.     Mental Status: He is alert and oriented to person, place, and time.     GCS: GCS eye subscore is 4. GCS verbal subscore is 5. GCS motor subscore is 6.  Psychiatric:        Attention and Perception: Attention normal.        Mood and Affect: Mood normal.        Speech: Speech normal.        Behavior: Behavior normal. Behavior is cooperative.      UC Treatments / Results  Labs (all labs ordered are listed, but only abnormal results are displayed) Labs Reviewed - No data to display  EKG   Radiology No results found.  Procedures Procedures (including critical care time)  Medications Ordered in UC Medications - No data to display  Initial Impression / Assessment and Plan / UC Course  I have reviewed the triage vital signs and the nursing notes.  Pertinent labs & imaging results that were available during my care of the patient were reviewed by me and considered in my medical decision making (see chart for details).  Clinical Course as  of 03/13/23 1404  Sun Mar 13, 2023  1305  Discussed extensively with patient that we are unable to prescribe antibiotics for 1 day history of symptoms.  Offered alternatives such as nasal lavage, saline mist, Flonase nasal spray, allergy med of choice.  Patient verbalized understanding to this provider, however still requesting antibiotics.  Referred patient back to his PCP if symptoms persist over the next week. [JD]    Clinical Course User Index [JD] Olympia Adelsberger, Jeanett Schlein, NP    Ddx: Allergy, allergy irritant exposure, URI, viral illness Final Clinical Impressions(s) / UC Diagnoses   Final diagnoses:  Congestion of nasal sinus  Elevated blood pressure reading     Discharge Instructions      Use over the counter allergy med of choice, flonase nasal spray as label directed. May follow up with PCP if symptoms worsen. We are unable to prescribe antibiotics after 1 day history of nasal congestion. Take home meds as prescribed. Follow up with PCP. Return as needed.     ED Prescriptions   None    PDMP not reviewed this encounter.   Tori Milks, NP [phone]

## 2023-03-13 NOTE — ED Triage Notes (Signed)
"  Recently cleaned out a hay shed and since then stopped up with nasal congestion, now pressure". Tried several OTC meds "not working". No fever. No "runny nose". "I may have breathed some in too". ? Sob at times.

## 2023-03-13 NOTE — Discharge Instructions (Addendum)
Use over the counter allergy med of choice, flonase nasal spray as label directed. May follow up with PCP if symptoms worsen. We are unable to prescribe antibiotics after 1 day history of nasal congestion. Take home meds as prescribed. Follow up with PCP. Return as needed.

## 2023-04-01 ENCOUNTER — Encounter: Payer: Self-pay | Admitting: Gastroenterology

## 2023-05-12 ENCOUNTER — Other Ambulatory Visit: Payer: Self-pay | Admitting: Family Medicine

## 2023-05-13 NOTE — Telephone Encounter (Signed)
Requested medications are due for refill today.  yes  Requested medications are on the active medications list.  yes  Last refill. 10/04/2022 #30 2 rf  Future visit scheduled.   no  Notes to clinic.  Refill not delegated.    Requested Prescriptions  Pending Prescriptions Disp Refills   zolpidem (AMBIEN) 10 MG tablet [Pharmacy Med Name: ZOLPIDEM TARTRATE 10 MG TABLET] 30 tablet     Sig: TAKE 1 TABLET BY MOUTH AT BEDTIME AS NEEDED FOR SLEEP     Not Delegated - Psychiatry:  Anxiolytics/Hypnotics Failed - 05/12/2023 11:28 AM      Failed - This refill cannot be delegated      Failed - Urine Drug Screen completed in last 360 days      Failed - Valid encounter within last 6 months    Recent Outpatient Visits           1 year ago General medical exam   [facility] Donita Brooks, MD   1 year ago Acute non-recurrent frontal sinusitis   [facility] Valentino Nose, NP   2 years ago COVID-19   [facility] Tanya Nones, Priscille Heidelberg, MD   3 years ago Fever in adult   [facility] Pickard, Priscille Heidelberg, MD   3 years ago Encounter for staple removal   [facility] Pickard, Priscille Heidelberg, MD

## 2023-05-27 ENCOUNTER — Ambulatory Visit: Payer: BC Managed Care – PPO | Admitting: Family Medicine

## 2023-05-27 ENCOUNTER — Ambulatory Visit
Admission: RE | Admit: 2023-05-27 | Discharge: 2023-05-27 | Disposition: A | Discharge status: Home / Self Care | Payer: BC Managed Care – PPO | Source: Ambulatory Visit | Attending: Family Medicine | Admitting: Family Medicine

## 2023-05-27 ENCOUNTER — Encounter: Payer: Self-pay | Admitting: Family Medicine

## 2023-05-27 VITALS — BP 136/82 | HR 95 | Temp 97.9°F | Ht 68.0 in | Wt 147.4 lb

## 2023-05-27 DIAGNOSIS — R051 Acute cough: Secondary | ICD-10-CM

## 2023-05-27 MED ORDER — AMOXICILLIN-POT CLAVULANATE 875-125 MG PO TABS: 1.0000 | ORAL_TABLET | Freq: Two times a day (BID) | ORAL | 0 refills | Status: DC

## 2023-05-27 MED ORDER — PREDNISONE 20 MG PO TABS
ORAL_TABLET | ORAL | 0 refills | Status: DC
Start: 2023-05-27 — End: 2023-08-02

## 2023-05-27 NOTE — Progress Notes (Signed)
Subjective:    Patient ID: Franklin Scott, male    DOB: [birth year], 52 y.o.   MRN: [id number]  Cough  Patient states that he has been coughing constantly for 3 weeks.  Initially started when he became choked at a restaurant.  He aspirated slightly and had to go outside to vomit because he became strangled on his food.  After that he has been having coughing spells ever since.  Typically if he eats or drinks, he will call follow-up to call nonproductive.  He does report some subjective fevers and chills.  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  Therefore he is exposed to mold spores, corn, as well as dust. Past Medical History:  Diagnosis Date   Hypertension    Internal hemorrhoids    No past surgical history on file. Current Outpatient Medications on File Prior to Visit  Medication Sig Dispense Refill   hydrochlorothiazide (HYDRODIURIL) 25 MG tablet Take 1 tablet (25 mg total) by mouth daily. 90 tablet 3   zolpidem (AMBIEN) 10 MG tablet TAKE 1 TABLET BY MOUTH AT BEDTIME AS NEEDED FOR SLEEP 30 tablet 2   No current facility-administered medications on file prior to visit.   Allergies  Allergen Reactions   Codeine Other (See Comments)    "floor moving"   Sulfa Antibiotics Nausea Only   Social History   Socioeconomic History   Marital status: Married    Spouse name: Not on file   Number of children: 1   Years of education: Not on file   Highest education level: 12th grade  Occupational History   Occupation: [employer]  Tobacco Use   Smoking status: Never   Smokeless tobacco: Never  Vaping Use   Vaping Use: Never used  Substance and Sexual Activity   Alcohol use: No   Drug use: No   Sexual activity: Not Currently  Other Topics Concern   Not on file  Social History Narrative   Not on file   Social Determinants of Health   Financial Resource Strain: Low Risk  (05/23/2023)   Overall Financial Resource Strain (CARDIA)     Difficulty of Paying Living Expenses: Not hard at all  Food Insecurity: No Food Insecurity (05/23/2023)   Hunger Vital Sign    Worried About Running Out of Food in the Last Year: Never true    Ran Out of Food in the Last Year: Never true  Transportation Needs: No Transportation Needs (05/23/2023)   PRAPARE - Administrator, Civil Service (Medical): No    Lack of Transportation (Non-Medical): No  Physical Activity: Insufficiently Active (05/23/2023)   Exercise Vital Sign    Days of Exercise per Week: 6 days    Minutes of Exercise per Session: 20 min  Stress: No Stress Concern Present (05/23/2023)   [employer] of Occupational Health - Occupational Stress Questionnaire    Feeling of Stress : Only a little  Social Connections: Socially Integrated (05/23/2023)   Social Connection and Isolation Panel [NHANES]    Frequency of Communication with Friends and Family: More than three times a week    Frequency of Social Gatherings with Friends and Family: More than three times a week    Attends Religious Services: More than 4 times per year    Active Member of [employer] or Organizations: Yes    Attends Banker Meetings: More than 4 times per year    Marital  Status: Married  Catering manager Violence: Not on file     Review of Systems  Respiratory:  Positive for cough.   All other systems reviewed and are negative.      Objective:   Physical Exam Vitals reviewed.  Constitutional:      Appearance: Normal appearance. He is normal weight. He is not ill-appearing, toxic-appearing or diaphoretic.  HENT:     Right Ear: Tympanic membrane and ear canal normal.     Left Ear: Tympanic membrane and ear canal normal.     Nose: No congestion or rhinorrhea.     Left Sinus: Maxillary sinus tenderness and frontal sinus tenderness present.     Mouth/Throat:     Mouth: Mucous membranes are moist.     Pharynx: Oropharynx is clear. No oropharyngeal exudate or posterior  oropharyngeal erythema.  Eyes:     Conjunctiva/sclera: Conjunctivae normal.  Neck:     Vascular: No carotid bruit.  Cardiovascular:     Rate and Rhythm: Regular rhythm. Tachycardia present.     Heart sounds: Normal heart sounds. No murmur heard.    No friction rub. No gallop.  Pulmonary:     Effort: Pulmonary effort is normal. No respiratory distress.     Breath sounds: No stridor. Rales present. No wheezing or rhonchi.  Abdominal:     General: Abdomen is flat. Bowel sounds are normal. There is no distension.     Palpations: Abdomen is soft. There is no mass.     Tenderness: There is no abdominal tenderness. There is no guarding or rebound.     Hernia: No hernia is present.  Genitourinary:    Pubic Area: No rash.      Penis: Circumcised. No phimosis, paraphimosis, hypospadias, erythema, tenderness, discharge, swelling or lesions.   Musculoskeletal:     Cervical back: Normal range of motion and neck supple. No rigidity or tenderness.     Right lower leg: No edema.     Left lower leg: No edema.  Lymphadenopathy:     Cervical: No cervical adenopathy.  Skin:    General: Skin is warm and dry.     Coloration: Skin is not jaundiced or pale.     Findings: No bruising, erythema, lesion or rash.  Neurological:     General: No focal deficit present.     Mental Status: He is alert.            . Assessment & Plan:  Acute cough - Plan: DG Chest 2 View I am very concerned that the patient may have aspiration pneumonia/pneumonitis versus possible farmer's lung.  Less likely would be acid reflux causing the cough.  Recommended to treat aspiration pneumonia in the right side radial Augmentin 875 mg twice daily and proceeding with a chest x-ray.  Add prednisone taper pack as I am concerned that there may be occupational exposure to dust, mold, and allergens that is also contributing to the cough.  I recommended that he wear a dust mask.  Recheck in 1 week if not better or sooner if worse

## 2023-08-02 ENCOUNTER — Encounter: Payer: Self-pay | Admitting: Family Medicine

## 2023-08-02 ENCOUNTER — Ambulatory Visit: Payer: BC Managed Care – PPO | Admitting: Family Medicine

## 2023-08-02 VITALS — BP 126/72 | HR 78 | Temp 97.5°F | Ht 68.0 in | Wt 152.0 lb

## 2023-08-02 DIAGNOSIS — M109 Gout, unspecified: Secondary | ICD-10-CM

## 2023-08-02 MED ORDER — COLCHICINE 0.6 MG PO TABS
0.6000 mg | ORAL_TABLET | Freq: Every day | ORAL | 0 refills | Status: DC
Start: 2023-08-02 — End: 2024-05-21

## 2023-08-02 MED ORDER — PREDNISONE 20 MG PO TABS: ORAL_TABLET | ORAL | 0 refills | Status: DC

## 2023-08-02 NOTE — Progress Notes (Signed)
Subjective:    Patient ID: Franklin Scott, male    DOB: September 01, 1971, 52 y.o.   MRN: [id number]  Patient has a history of gout and his mother and his brother.  He has never had a gout attack for.  However 2 days ago he developed pain redness and swelling in his right first MTP.  He states that it hurts to touch the joint.  It hurts to walk.  He denies any injury to the joint.  He denies any fevers or chills. Past Medical History:  Diagnosis Date   Hypertension    Internal hemorrhoids    No past surgical history on file. Current Outpatient Medications on File Prior to Visit  Medication Sig Dispense Refill   hydrochlorothiazide (HYDRODIURIL) 25 MG tablet Take 1 tablet (25 mg total) by mouth daily. 90 tablet 3   zolpidem (AMBIEN) 10 MG tablet TAKE 1 TABLET BY MOUTH AT BEDTIME AS NEEDED FOR SLEEP 30 tablet 2   No current facility-administered medications on file prior to visit.   Allergies  Allergen Reactions   Codeine Other (See Comments)    "floor moving"   Sulfa Antibiotics Nausea Only   Social History   Socioeconomic History   Marital status: Married    Spouse name: Not on file   Number of children: 1   Years of education: Not on file   Highest education level: 12th grade  Occupational History   Occupation: [employer]  Tobacco Use   Smoking status: Never   Smokeless tobacco: Never  Vaping Use   Vaping status: Never Used  Substance and Sexual Activity   Alcohol use: No   Drug use: No   Sexual activity: Not Currently  Other Topics Concern   Not on file  Social History Narrative   Not on file   Social Determinants of Health   Financial Resource Strain: Low Risk  (05/23/2023)   Overall Financial Resource Strain (CARDIA)    Difficulty of Paying Living Expenses: Not hard at all  Food Insecurity: No Food Insecurity (05/23/2023)   Hunger Vital Sign    Worried About Running Out of Food in the Last Year: Never true    Ran Out of Food in the Last Year: Never true   Transportation Needs: No Transportation Needs (05/23/2023)   PRAPARE - Administrator, Civil Service (Medical): No    Lack of Transportation (Non-Medical): No  Physical Activity: Insufficiently Active (05/23/2023)   Exercise Vital Sign    Days of Exercise per Week: 6 days    Minutes of Exercise per Session: 20 min  Stress: No Stress Concern Present (05/23/2023)   [employer] of Occupational Health - Occupational Stress Questionnaire    Feeling of Stress : Only a little  Social Connections: Socially Integrated (05/23/2023)   Social Connection and Isolation Panel [NHANES]    Frequency of Communication with Friends and Family: More than three times a week    Frequency of Social Gatherings with Friends and Family: More than three times a week    Attends Religious Services: More than 4 times per year    Active Member of [employer] or Organizations: Yes    Attends Banker Meetings: More than 4 times per year    Marital Status: Married  Catering manager Violence: Unknown (09/16/2022)   Received from [facility]   HITS    Physically Hurt: Not on file    Insult or Talk Down To: Not on file    Threaten Physical  Harm: Not on file    Scream or Curse: Not on file     Review of Systems  All other systems reviewed and are negative.      Objective:   Physical Exam Vitals reviewed.  Constitutional:      Appearance: Normal appearance. He is normal weight. He is not ill-appearing, toxic-appearing or diaphoretic.  HENT:     Nose:     Left Sinus: Maxillary sinus tenderness and frontal sinus tenderness present.  Neck:     Vascular: No carotid bruit.  Cardiovascular:     Rate and Rhythm: Regular rhythm. Tachycardia present.     Pulses:          Dorsalis pedis pulses are 2+ on the right side.       Posterior tibial pulses are 2+ on the right side.     Heart sounds: Normal heart sounds. No murmur heard.    No friction rub. No gallop.  Pulmonary:     Effort:  Pulmonary effort is normal. No respiratory distress.     Breath sounds: No stridor. No wheezing or rhonchi.  Genitourinary:    Pubic Area: No rash.      Penis: Circumcised. No phimosis, paraphimosis, hypospadias, erythema, tenderness, discharge, swelling or lesions.   Musculoskeletal:     Cervical back: Normal range of motion and neck supple. No rigidity or tenderness.     Right lower leg: No edema.     Left lower leg: No edema.     Right foot: Decreased range of motion.       Feet:  Feet:     Right foot:     Skin integrity: Erythema and warmth present. No ulcer, blister or skin breakdown.  Lymphadenopathy:     Cervical: No cervical adenopathy.  Skin:    General: Skin is warm and dry.     Coloration: Skin is not jaundiced or pale.     Findings: No bruising, erythema, lesion or rash.  Neurological:     General: No focal deficit present.     Mental Status: He is alert.            . Assessment & Plan:  Podagra - Plan: Uric acid I suspect a gout attack.  Begin prednisone taper pack immediately.  Drink plenty of water.  I gave him a prescription for colchicine to have on hand in the future with instructions on how to take it.  If gout attacks become frequent, we will discontinue hydrochlorothiazide and likely start the patient on allopurinol.  However this is his first attack.  Will not make a change at the present time

## 2023-08-03 LAB — URIC ACID: Uric Acid, Serum: 10.6 mg/dL — ABNORMAL HIGH (ref 4.0–8.0)

## 2023-08-04 ENCOUNTER — Encounter: Payer: Self-pay | Admitting: Family Medicine

## 2023-08-24 ENCOUNTER — Other Ambulatory Visit: Payer: Self-pay | Admitting: Family Medicine

## 2023-08-25 NOTE — Telephone Encounter (Signed)
Requested medication (s) are due for refill today:   Yes  Requested medication (s) are on the active medication list:   Yes  Future visit scheduled:   No   LOV 08/02/2023 he was started on this medication   Last ordered: 08/02/2023 #30, 0 refills  Returned because labs are due so unable to fill per protocol.      Requested Prescriptions  Pending Prescriptions Disp Refills   colchicine 0.6 MG tablet [Pharmacy Med Name: COLCHICINE 0.6 MG TABLET] 90 tablet 1    Sig: Take 1 tablet (0.6 mg total) by mouth daily. Take 2 pills immediately at the first sign of gout     Endocrinology:  Gout Agents - colchicine Failed - 08/24/2023  9:31 AM      Failed - Cr in normal range and within 360 days    Creat  Date Value Ref Range Status  01/19/2022 1.05 0.70 - 1.30 mg/dL Final         Failed - ALT in normal range and within 360 days    ALT  Date Value Ref Range Status  01/19/2022 19 9 - 46 U/L Final         Failed - AST in normal range and within 360 days    AST  Date Value Ref Range Status  01/19/2022 23 10 - 35 U/L Final         Failed - Valid encounter within last 12 months    Recent Outpatient Visits           1 year ago General medical exam   [facility] Donita Brooks, MD   1 year ago Acute non-recurrent frontal sinusitis   [facility] Valentino Nose, NP   2 years ago COVID-19   [facility] Pickard, Priscille Heidelberg, MD   3 years ago Fever in adult   [facility] Pickard, Priscille Heidelberg, MD   4 years ago Encounter for staple removal   [facility] Pickard, Priscille Heidelberg, MD              Failed - CBC within normal limits and completed in the last 12 months    WBC  Date Value Ref Range Status  01/19/2022 6.5 3.8 - 10.8 Thousand/uL Final   RBC  Date Value Ref Range Status  01/19/2022 5.42 4.20 - 5.80 Million/uL Final   Hemoglobin  Date Value Ref Range Status  01/19/2022 15.5 13.2 - 17.1 g/dL Final    HCT  Date Value Ref Range Status  01/19/2022 46.3 38.5 - 50.0 % Final   MCHC  Date Value Ref Range Status  01/19/2022 33.5 32.0 - 36.0 g/dL Final   [facility]  Date Value Ref Range Status  01/19/2022 28.6 27.0 - 33.0 pg Final   MCV  Date Value Ref Range Status  01/19/2022 85.4 80.0 - 100.0 fL Final   No results found for: "PLTCOUNTKUC", "LABPLAT", "POCPLA" RDW  Date Value Ref Range Status  01/19/2022 12.2 11.0 - 15.0 % Final

## 2023-09-07 ENCOUNTER — Encounter: Payer: Self-pay | Admitting: Family Medicine

## 2023-09-07 ENCOUNTER — Ambulatory Visit: Payer: BC Managed Care – PPO | Admitting: Family Medicine

## 2023-09-07 VITALS — BP 118/82 | HR 72 | Temp 98.1°F | Ht 68.0 in | Wt 147.0 lb

## 2023-09-07 DIAGNOSIS — M109 Gout, unspecified: Secondary | ICD-10-CM

## 2023-09-07 MED ORDER — PREDNISONE 20 MG PO TABS: ORAL_TABLET | ORAL | 0 refills | Status: DC

## 2023-09-07 NOTE — Assessment & Plan Note (Signed)
Presentation consistent with acute gout flare of right great toe MTP joint. Has had confirmatory uric acid levels one month ago. Is taking Colchicine 0.6mg  daily and reports upset stomach with this, he is taking with food. Counseled on importance of low purine diet, handout provided. Discussed pathophysiology of gout. Provided with Prednisone taper as this helped with his symptoms in the past and Colchicine is not resolving pain currently. Educated on medication administration instructions for future episodes. Encouraged to return to office if he has another flare for consideration of discontinuation of hydrochlorothiazide and initiation of Allopurinol.

## 2023-09-07 NOTE — Patient Instructions (Addendum)
1.2 mg at the first sign of flare, followed by 0.6 mg after 1 hour, take 0.6mg  (1 tablet) daily up to 3 days

## 2023-09-07 NOTE — Progress Notes (Signed)
Subjective:  HPI: Franklin Scott is a 52 y.o. male presenting on 09/07/2023 for Acute Visit (very painful gout flareup - med not helping and has upset his stomach - JBG\\\)   HPI Patient is in today for right great toe MTP joint sharp pain, redness, and swelling. He did take a colchicine tablet yesterday AM at onset and this AM. The pain is worse when something touches the area. He did have similar pain 1 month ago in the same area and was diagnosed with gout with elevated uric acid level 10.6 on 8/20. He does report consuming large amounts of mountain dew. Denies fever, chills, body aches.  Review of Systems  All other systems reviewed and are negative.   Relevant past medical history reviewed and updated as indicated.   Past Medical History:  Diagnosis Date   Gout    Hypertension    Internal hemorrhoids      No past surgical history on file.  Allergies and medications reviewed and updated.   Current Outpatient Medications:    colchicine 0.6 MG tablet, Take 1 tablet (0.6 mg total) by mouth daily. Take 2 pills immediately at the first sign of gout, Disp: 30 tablet, Rfl: 0   hydrochlorothiazide (HYDRODIURIL) 25 MG tablet, Take 1 tablet (25 mg total) by mouth daily., Disp: 90 tablet, Rfl: 3   zolpidem (AMBIEN) 10 MG tablet, TAKE 1 TABLET BY MOUTH AT BEDTIME AS NEEDED FOR SLEEP, Disp: 30 tablet, Rfl: 2   predniSONE (DELTASONE) 20 MG tablet, 3 tabs poqday 1-2, 2 tabs poqday 3-4, 1 tab poqday 5-6, Disp: 12 tablet, Rfl: 0  Allergies  Allergen Reactions   Codeine Other (See Comments)    "floor moving"   Sulfa Antibiotics Nausea Only    Objective:   BP 118/82   Pulse 72   Temp 98.1 F (36.7 C) (Oral)   Ht 5\' 8"  (1.727 m)   Wt 147 lb (66.7 kg)   SpO2 96%   BMI 22.35 kg/m      09/07/2023    2:10 PM 08/02/2023   10:59 AM 05/27/2023   10:54 AM  Vitals with BMI  Height 5\' 8"  5\' 8"  5\' 8"   Weight 147 lbs 152 lbs 147 lbs 6 oz  BMI 22.36 23.12 22.42  Systolic 118 126 161   Diastolic 82 72 82  Pulse 72 78 95     Physical Exam Vitals and nursing note reviewed.  Constitutional:      Appearance: Normal appearance. He is normal weight.  HENT:     Head: Normocephalic and atraumatic.  Feet:     Right foot:     Skin integrity: Erythema (MTP joint) present.  Skin:    General: Skin is warm and dry.     Capillary Refill: Capillary refill takes less than 2 seconds.  Neurological:     General: No focal deficit present.     Mental Status: He is alert and oriented to person, place, and time. Mental status is at baseline.  Psychiatric:        Mood and Affect: Mood normal.        Behavior: Behavior normal.        Thought Content: Thought content normal.        Judgment: Judgment normal.     Assessment & Plan:  Acute gout involving toe of right foot, unspecified cause Assessment & Plan: Presentation consistent with acute gout flare of right great toe MTP joint. Has had confirmatory uric acid levels one  month ago. Is taking Colchicine 0.6mg  daily and reports upset stomach with this, he is taking with food. Counseled on importance of low purine diet, handout provided. Discussed pathophysiology of gout. Provided with Prednisone taper as this helped with his symptoms in the past and Colchicine is not resolving pain currently. Educated on medication administration instructions for future episodes. Encouraged to return to office if he has another flare for consideration of discontinuation of hydrochlorothiazide and initiation of Allopurinol.   Other orders -     predniSONE; 3 tabs poqday 1-2, 2 tabs poqday 3-4, 1 tab poqday 5-6  Dispense: 12 tablet; Refill: 0     Follow up plan: Return if symptoms worsen or fail to improve.  Park Meo, FNP

## 2023-09-15 ENCOUNTER — Encounter: Payer: Self-pay | Admitting: Family Medicine

## 2023-09-15 ENCOUNTER — Ambulatory Visit: Payer: BC Managed Care – PPO | Admitting: Family Medicine

## 2023-09-15 VITALS — BP 120/72 | HR 63 | Temp 97.9°F | Ht 68.0 in | Wt 146.0 lb

## 2023-09-15 DIAGNOSIS — M109 Gout, unspecified: Secondary | ICD-10-CM | POA: Diagnosis not present

## 2023-09-15 MED ORDER — PREDNISONE 20 MG PO TABS: ORAL_TABLET | ORAL | 0 refills | Status: DC

## 2023-09-15 MED ORDER — ALLOPURINOL 100 MG PO TABS: 200.0000 mg | ORAL_TABLET | Freq: Every day | ORAL | 6 refills | Status: DC

## 2023-09-15 NOTE — Progress Notes (Signed)
Subjective:    Patient ID: Franklin Scott, male    DOB: 26-Apr-1971, 52 y.o.   MRN: [id number]  Patient has had his third episode of podagra in his right first MTP joint.  He saw my partner recently who put him on prednisone.  This helped with the pain is still there.  He is here to discuss options Past Medical History:  Diagnosis Date   Gout    Hypertension    Internal hemorrhoids    No past surgical history on file. Current Outpatient Medications on File Prior to Visit  Medication Sig Dispense Refill   colchicine 0.6 MG tablet Take 1 tablet (0.6 mg total) by mouth daily. Take 2 pills immediately at the first sign of gout 30 tablet 0   hydrochlorothiazide (HYDRODIURIL) 25 MG tablet Take 1 tablet (25 mg total) by mouth daily. 90 tablet 3   zolpidem (AMBIEN) 10 MG tablet TAKE 1 TABLET BY MOUTH AT BEDTIME AS NEEDED FOR SLEEP 30 tablet 2   predniSONE (DELTASONE) 20 MG tablet 3 tabs poqday 1-2, 2 tabs poqday 3-4, 1 tab poqday 5-6 (Patient not taking: Reported on 09/15/2023) 12 tablet 0   No current facility-administered medications on file prior to visit.   Allergies  Allergen Reactions   Codeine Other (See Comments)    "floor moving"   Sulfa Antibiotics Nausea Only   Social History   Socioeconomic History   Marital status: Married    Spouse name: Not on file   Number of children: 1   Years of education: Not on file   Highest education level: 12th grade  Occupational History   Occupation: [employer]  Tobacco Use   Smoking status: Never   Smokeless tobacco: Never  Vaping Use   Vaping status: Never Used  Substance and Sexual Activity   Alcohol use: No   Drug use: No   Sexual activity: Not Currently  Other Topics Concern   Not on file  Social History Narrative   Not on file   Social Determinants of Health   Financial Resource Strain: Low Risk  (05/23/2023)   Overall Financial Resource Strain (CARDIA)    Difficulty of Paying Living Expenses: Not hard at all  Food Insecurity:  No Food Insecurity (05/23/2023)   Hunger Vital Sign    Worried About Running Out of Food in the Last Year: Never true    Ran Out of Food in the Last Year: Never true  Transportation Needs: No Transportation Needs (05/23/2023)   PRAPARE - Administrator, Civil Service (Medical): No    Lack of Transportation (Non-Medical): No  Physical Activity: Insufficiently Active (05/23/2023)   Exercise Vital Sign    Days of Exercise per Week: 6 days    Minutes of Exercise per Session: 20 min  Stress: No Stress Concern Present (05/23/2023)   [employer] of Occupational Health - Occupational Stress Questionnaire    Feeling of Stress : Only a little  Social Connections: Socially Integrated (05/23/2023)   Social Connection and Isolation Panel [NHANES]    Frequency of Communication with Friends and Family: More than three times a week    Frequency of Social Gatherings with Friends and Family: More than three times a week    Attends Religious Services: More than 4 times per year    Active Member of [employer] or Organizations: Yes    Attends Engineer, structural: More than 4 times per year    Marital Status: Married  Catering manager Violence: Unknown (  09/16/2022)   [facility]   HITS    Physically Hurt: Not on file    Insult or Talk Down To: Not on file    Threaten Physical Harm: Not on file    Scream or Curse: Not on file     Review of Systems  All other systems reviewed and are negative.      Objective:   Physical Exam Vitals reviewed.  Constitutional:      Appearance: Normal appearance. He is normal weight. He is not ill-appearing, toxic-appearing or diaphoretic.  HENT:     Nose:     Left Sinus: Maxillary sinus tenderness and frontal sinus tenderness present.  Neck:     Vascular: No carotid bruit.  Cardiovascular:     Rate and Rhythm: Regular rhythm. Tachycardia present.     Pulses:          Dorsalis pedis pulses are 2+ on the right  side.       Posterior tibial pulses are 2+ on the right side.     Heart sounds: Normal heart sounds. No murmur heard.    No friction rub. No gallop.  Pulmonary:     Effort: Pulmonary effort is normal. No respiratory distress.     Breath sounds: No stridor. No wheezing or rhonchi.  Genitourinary:    Pubic Area: No rash.      Penis: Circumcised. No phimosis, paraphimosis, hypospadias, erythema, tenderness, discharge, swelling or lesions.   Musculoskeletal:     Cervical back: Normal range of motion and neck supple. No rigidity or tenderness.     Right lower leg: No edema.     Left lower leg: No edema.     Right foot: Decreased range of motion.       Feet:  Feet:     Right foot:     Skin integrity: Erythema and warmth present. No ulcer, blister or skin breakdown.  Lymphadenopathy:     Cervical: No cervical adenopathy.  Skin:    General: Skin is warm and dry.     Coloration: Skin is not jaundiced or pale.     Findings: No bruising, erythema, lesion or rash.  Neurological:     General: No focal deficit present.     Mental Status: He is alert.            . Assessment & Plan:  Acute gout involving toe of right foot, unspecified cause Patient's uric acid level was greater than 10.  Treat gout flare with prednisone pack again.  Once he is 100% better, start allopurinol 200 mg a day along with colchicine 0.6 mg a day for 3 months.  Once uric acid level is less than 6 discontinue colchicine.  Stop hydrochlorothiazide.  Encouraged the patient to drink plenty of fluids to avoid dehydration

## 2023-11-22 ENCOUNTER — Emergency Department (HOSPITAL_COMMUNITY): Payer: BC Managed Care – PPO

## 2023-11-22 ENCOUNTER — Emergency Department (HOSPITAL_COMMUNITY)
Admission: EM | Admit: 2023-11-22 | Discharge: 2023-11-22 | Disposition: A | Discharge status: Home / Self Care | Payer: BC Managed Care – PPO | Attending: Emergency Medicine | Admitting: Emergency Medicine

## 2023-11-22 ENCOUNTER — Encounter (HOSPITAL_COMMUNITY): Payer: Self-pay | Admitting: *Deleted

## 2023-11-22 ENCOUNTER — Other Ambulatory Visit: Payer: Self-pay

## 2023-11-22 DIAGNOSIS — R413 Other amnesia: Secondary | ICD-10-CM | POA: Insufficient documentation

## 2023-11-22 LAB — BASIC METABOLIC PANEL
Anion gap: 10 (ref 5–15)
BUN: 12 mg/dL (ref 6–20)
CO2: 25 mmol/L (ref 22–32)
Calcium: 9.9 mg/dL (ref 8.9–10.3)
Chloride: 106 mmol/L (ref 98–111)
Creatinine, Ser: 0.85 mg/dL (ref 0.61–1.24)
GFR, Estimated: >60 mL/min (ref >60)
Glucose, Bld: 91 mg/dL (ref 70–99)
Potassium: 3.9 mmol/L (ref 3.5–5.1)
Sodium: 141 mmol/L (ref 135–145)

## 2023-11-22 LAB — URINALYSIS, ROUTINE W REFLEX MICROSCOPIC
Bilirubin Urine: NEGATIVE
Glucose, UA: NEGATIVE mg/dL
Hgb urine dipstick: NEGATIVE
Ketones, ur: NEGATIVE mg/dL
Leukocytes,Ua: NEGATIVE
Nitrite: NEGATIVE
Protein, ur: NEGATIVE mg/dL
Specific Gravity, Urine: 1.012 (ref 1.005–1.030)
pH: 7 (ref 5.0–8.0)

## 2023-11-22 LAB — CBC
HCT: 46.4 % (ref 39.0–52.0)
Hemoglobin: 15 g/dL (ref 13.0–17.0)
MCH: 27.8 pg (ref 26.0–34.0)
MCHC: 32.3 g/dL (ref 30.0–36.0)
MCV: 86.1 fL (ref 80.0–100.0)
Platelets: 350 10*3/uL (ref 150–400)
RBC: 5.39 MIL/uL (ref 4.22–5.81)
RDW: 12.3 % (ref 11.5–15.5)
WBC: 6.7 10*3/uL (ref 4.0–10.5)
nRBC: 0 % (ref 0.0–0.2)

## 2023-11-22 LAB — CBG MONITORING, ED: Glucose-Capillary: 78 mg/dL (ref 70–99)
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
## 2023-11-22 NOTE — Discharge Instructions (Addendum)
Evaluation today was overall reassuring.  Recommend you follow-up with your primary care doctor.  Would recommend trying melatonin and to discontinue the Ambien.  If you have any visual changes, headache, amnesia, weakness or numbness in your extremities, chest pain or palpitations or any other concerning symptom please return to the emergency department further evaluation.

## 2023-12-05 ENCOUNTER — Ambulatory Visit: Payer: BC Managed Care – PPO | Admitting: Family Medicine

## 2023-12-05 VITALS — BP 160/90 | HR 81 | Temp 98.3°F | Ht 66.0 in | Wt 144.4 lb

## 2023-12-05 DIAGNOSIS — G4700 Insomnia, unspecified: Secondary | ICD-10-CM | POA: Diagnosis not present

## 2023-12-05 DIAGNOSIS — I10 Essential (primary) hypertension: Secondary | ICD-10-CM | POA: Diagnosis not present

## 2023-12-05 MED ORDER — HYDROXYZINE PAMOATE 25 MG PO CAPS: 25.0000 mg | ORAL_CAPSULE | Freq: Three times a day (TID) | ORAL | 4 refills | Status: AC | PRN

## 2023-12-05 MED ORDER — AMLODIPINE BESYLATE 10 MG PO TABS: 10.0000 mg | ORAL_TABLET | Freq: Every day | ORAL | 3 refills | Status: DC
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
## 2024-05-19 ENCOUNTER — Other Ambulatory Visit: Payer: Self-pay | Admitting: Family Medicine

## 2024-05-21 ENCOUNTER — Other Ambulatory Visit: Payer: Self-pay

## 2024-06-19 ENCOUNTER — Other Ambulatory Visit: Payer: Self-pay

## 2024-06-19 ENCOUNTER — Telehealth: Payer: Self-pay | Admitting: Family Medicine

## 2024-06-19 MED ORDER — COLCHICINE 0.6 MG PO TABS: 0.6000 mg | ORAL_TABLET | Freq: Every day | ORAL | 0 refills | Status: DC

## 2024-06-19 NOTE — Telephone Encounter (Signed)
 Prescription Request  06/19/2024  LOV: 12/05/2023  What is the name of the medication or equipment? colchicine  0.6 MG tablet   Have you contacted your pharmacy to request a refill? Yes   Which pharmacy would you like this sent to?  [id number] [address], [place] - [address] [place] [address] Phone: [phone] Fax: [phone]    Patient notified that their request is being sent to the clinical staff for review and that they should receive a response within 2 business days.   Please advise at  [facility]

## 2024-06-20 ENCOUNTER — Other Ambulatory Visit: Payer: Self-pay

## 2024-06-20 MED ORDER — COLCHICINE 0.6 MG PO TABS: 0.6000 mg | ORAL_TABLET | Freq: Every day | ORAL | 1 refills | Status: DC

## 2024-07-21 ENCOUNTER — Other Ambulatory Visit: Payer: Self-pay | Admitting: Family Medicine

## 2024-09-07 ENCOUNTER — Encounter: Payer: Self-pay | Admitting: Family Medicine

## 2024-09-07 ENCOUNTER — Ambulatory Visit: Admitting: Family Medicine

## 2024-09-07 VITALS — BP 130/72 | HR 77 | Temp 98.1°F | Ht 66.0 in | Wt 143.5 lb

## 2024-09-07 DIAGNOSIS — M109 Gout, unspecified: Secondary | ICD-10-CM

## 2024-09-07 DIAGNOSIS — R399 Unspecified symptoms and signs involving the genitourinary system: Secondary | ICD-10-CM | POA: Diagnosis not present

## 2024-09-07 LAB — PSA: PSA: 1.35 ng/mL (ref <=4.00)

## 2024-09-07 LAB — URIC ACID: Uric Acid, Serum: 5.1 mg/dL (ref 4.0–8.0)

## 2024-09-07 MED ORDER — COLCHICINE 0.6 MG PO TABS: 0.6000 mg | ORAL_TABLET | Freq: Every day | ORAL | 3 refills | Status: AC

## 2024-09-07 MED ORDER — ALLOPURINOL 100 MG PO TABS: 200.0000 mg | ORAL_TABLET | Freq: Every day | ORAL | 2 refills | Status: DC

## 2024-09-07 NOTE — Progress Notes (Signed)
 Subjective:    Patient ID: Franklin Scott, male    DOB: November 08, 1971, 53 y.o.   MRN: [id number] Recently we switched the patient from hydrochlorothiazide  to amlodipine .  We did this because of recurrent gout flares.  His blood pressure today is well-controlled at 130/72 however he continues to get recurrent gout flares in his first MTP joint on his right foot.  He states that it hurts so bad at times he is unable to walk on it.  He has been taking allopurinol  200 mg a day and also does take colchicine  when necessary.  He is due to recheck a uric acid level.  Has not had any x-rays of the first MTP joint.  He also reports increased urinary frequency and urgency.  He states that he has to urinate almost 20 times a day.  The urgency will hit him suddenly to the point that it hurts by the time he gets to the bathroom simply holding back.  He denies any weak stream.  He denies any hematuria. Past Medical History:  Diagnosis Date   Gout    Hypertension    Internal hemorrhoids    No past surgical history on file. Current Outpatient Medications on File Prior to Visit  Medication Sig Dispense Refill   allopurinol  (ZYLOPRIM ) 100 MG tablet TAKE 2 TABLETS BY MOUTH EVERY DAY 180 tablet 2   amLODipine  (NORVASC ) 10 MG tablet Take 1 tablet (10 mg total) by mouth daily. 90 tablet 3   colchicine  0.6 MG tablet Take 1 tablet (0.6 mg total) by mouth daily. Take 2 pills immediately at the first sign of gout 90 tablet 1   hydrOXYzine  (VISTARIL ) 25 MG capsule Take 1 capsule (25 mg total) by mouth every 8 (eight) hours as needed (sleep). 30 capsule 4   pseudoephedrine (SUDAFED) 30 MG tablet Take 60 mg by mouth every 4 (four) hours as needed for congestion.     zolpidem  (AMBIEN ) 10 MG tablet TAKE 1 TABLET BY MOUTH AT BEDTIME AS NEEDED FOR SLEEP 30 tablet 2   No current facility-administered medications on file prior to visit.   Allergies  Allergen Reactions   Codeine Other (See Comments) and Nausea Only    floor  moving   Sulfa Antibiotics Nausea Only   Social History   Socioeconomic History   Marital status: Married    Spouse name: Not on file   Number of children: 1   Years of education: Not on file   Highest education level: 12th grade  Occupational History   Occupation: [employer]  Tobacco Use   Smoking status: Never   Smokeless tobacco: Never  Vaping Use   Vaping status: Never Used  Substance and Sexual Activity   Alcohol use: No   Drug use: No   Sexual activity: Not Currently  Other Topics Concern   Not on file  Social History Narrative   Not on file   Social Drivers of Health   Financial Resource Strain: Low Risk  (05/23/2023)   Overall Financial Resource Strain (CARDIA)    Difficulty of Paying Living Expenses: Not hard at all  Food Insecurity: No Food Insecurity (05/23/2023)   Hunger Vital Sign    Worried About Running Out of Food in the Last Year: Never true    Ran Out of Food in the Last Year: Never true  Transportation Needs: No Transportation Needs (05/23/2023)   PRAPARE - Administrator, Civil Service (Medical): No    Lack of Transportation (Non-Medical): No  Physical Activity: Insufficiently Active (05/23/2023)   Exercise Vital Sign    Days of Exercise per Week: 6 days    Minutes of Exercise per Session: 20 min  Stress: No Stress Concern Present (05/23/2023)   [employer] of Occupational Health - Occupational Stress Questionnaire    Feeling of Stress : Only a little  Social Connections: Socially Integrated (05/23/2023)   Social Connection and Isolation Panel    Frequency of Communication with Friends and Family: More than three times a week    Frequency of Social Gatherings with Friends and Family: More than three times a week    Attends Religious Services: More than 4 times per year    Active Member of [employer] or Organizations: Yes    Attends Banker Meetings: More than 4 times per year    Marital Status: Married  Catering manager Violence:  Unknown (09/16/2022)   Received from [facility]   HITS    Physically Hurt: Not on file    Insult or Talk Down To: Not on file    Threaten Physical Harm: Not on file    Scream or Curse: Not on file     Review of Systems  All other systems reviewed and are negative.      Objective:   Physical Exam Vitals reviewed.  Constitutional:      Appearance: Normal appearance. He is normal weight. He is not ill-appearing, toxic-appearing or diaphoretic.  Cardiovascular:     Rate and Rhythm: Normal rate and regular rhythm.     Heart sounds: Normal heart sounds. No murmur heard.    No friction rub. No gallop.  Pulmonary:     Effort: Pulmonary effort is normal. No respiratory distress.     Breath sounds: No stridor. No wheezing, rhonchi or rales.  Genitourinary:    Pubic Area: No rash.      Penis: Circumcised. No phimosis, paraphimosis, hypospadias, erythema, tenderness, discharge, swelling or lesions.   Musculoskeletal:     Right lower leg: No edema.     Left lower leg: No edema.  Skin:    General: Skin is warm and dry.     Coloration: Skin is not jaundiced or pale.     Findings: No bruising, erythema, lesion or rash.  Neurological:     General: No focal deficit present.     Mental Status: He is alert.            . Assessment & Plan:  Acute gout involving toe of right foot, unspecified cause - Plan: allopurinol  (ZYLOPRIM ) 100 MG tablet, Uric acid  Lower urinary tract symptoms (LUTS) - Plan: PSA, Urinalysis, Routine w reflex microscopic Blood pressure today is well-controlled.  I will check a uric acid level.  If uric acid level is greater than 6 I will uptitrate the dose of allopurinol .  If uric acid level is low normal, I would recommend getting an x-ray of the MTP joint to see if he has significant arthropathy that could be causing the pain.  I believe his lower urinary tract symptoms are related to overactive bladder.  I will check a PSA along with a urinalysis.  If they  are normal I will plan to try the patient on Vesicare.  If his PSA is elevated consider trying doxazosin .  However he has tried Flomax  in the past with minimal improvement and did experience retrograde ejaculation with that

## 2024-09-08 LAB — URINALYSIS, ROUTINE W REFLEX MICROSCOPIC
Bilirubin Urine: NEGATIVE
Glucose, UA: NEGATIVE
Hgb urine dipstick: NEGATIVE
Ketones, ur: NEGATIVE
Leukocytes,Ua: NEGATIVE
Nitrite: NEGATIVE
Protein, ur: NEGATIVE
Specific Gravity, Urine: 1.011 (ref 1.001–1.035)
pH: 6.5 (ref 5.0–8.0)

## 2024-09-10 ENCOUNTER — Ambulatory Visit: Payer: Self-pay | Admitting: Family Medicine

## 2024-09-10 ENCOUNTER — Other Ambulatory Visit: Payer: Self-pay

## 2024-09-10 DIAGNOSIS — M109 Gout, unspecified: Secondary | ICD-10-CM

## 2024-09-10 MED ORDER — ALLOPURINOL 100 MG PO TABS: 300.0000 mg | ORAL_TABLET | Freq: Every day | ORAL | 2 refills | Status: AC

## 2024-09-10 MED ORDER — SOLIFENACIN SUCCINATE 10 MG PO TABS: 10.0000 mg | ORAL_TABLET | Freq: Every day | ORAL | 1 refills | Status: AC

## 2024-09-11 ENCOUNTER — Other Ambulatory Visit: Payer: Self-pay | Admitting: Family Medicine

## 2024-11-22 ENCOUNTER — Ambulatory Visit: Payer: Self-pay

## 2024-11-22 ENCOUNTER — Ambulatory Visit: Admitting: Family Medicine

## 2024-11-22 ENCOUNTER — Encounter: Payer: Self-pay | Admitting: Family Medicine

## 2024-11-22 VITALS — BP 132/82 | HR 106 | Temp 99.2°F | Ht 66.0 in | Wt 144.4 lb

## 2024-11-22 DIAGNOSIS — R6883 Chills (without fever): Secondary | ICD-10-CM

## 2024-11-22 DIAGNOSIS — R52 Pain, unspecified: Secondary | ICD-10-CM

## 2024-11-22 LAB — INFLUENZA A AND B AG, IMMUNOASSAY
INFLUENZA A ANTIGEN: NOT DETECTED
INFLUENZA B ANTIGEN: NOT DETECTED

## 2024-11-22 NOTE — Telephone Encounter (Signed)
 FYI Only or Action Required?: FYI only for provider: appointment scheduled on 11/22/24.  Patient was last seen in primary care on 09/07/2024 by Duanne Butler DASEN, MD.  Called Nurse Triage reporting Sinusitis and Cough.  Symptoms began several days ago.  Interventions attempted: OTC medications: Tylenol  sinus, ibuprofen .  Symptoms are: gradually worsening.  Triage Disposition: See PCP When Office is Open (Within 3 Days)  Patient/caregiver understands and will follow disposition?: Yes           Copied from CRM #[id number]. Topic: Clinical - Red Word Triage >> Nov 22, 2024 11:22 AM Tobias L wrote: Red Word that prompted transfer to Nurse Triage: possible sinus infection, day 3 or 4, chills body ache, really dark colored mucous Reason for Disposition  Lots of coughing  Answer Assessment - Initial Assessment Questions 1. LOCATION: Where does it hurt?      Between eyes.  2. ONSET: When did the sinus pain start?  (e.g., hours, days)      Monday.  3. SEVERITY: How bad is the pain?   (Scale 0-10; or none, mild, moderate or severe)     5/10.  4. RECURRENT SYMPTOM: Have you ever had sinus problems before? If Yes, ask: When was the last time? and What happened that time?      Yes, he states he will get a cold and it lingers and turns into a sinus infection. Unsure of last time.  5. NASAL CONGESTION: Is the nose blocked? If Yes, ask: Can you open it or must you breathe through your mouth?     No.  6. NASAL DISCHARGE: Do you have discharge from your nose? If so ask, What color?     Unsure if mucous is coming up with his cough or out coming out of nasal drainage.  7. FEVER: Do you have a fever? If Yes, ask: What is it, how was it measured, and when did it start?      No, felt feverish but no temperature over 100 degrees.  8. OTHER SYMPTOMS: Do you have any other symptoms? (e.g., sore throat, cough, earache, difficulty breathing)     Chills, body aches,  cough with green (dark) mucous.  Protocols used: Sinus Pain or Congestion-A-AH

## 2024-11-22 NOTE — Progress Notes (Signed)
 Subjective:    Patient ID: Franklin Scott, male    DOB: 07-29-1971, 53 y.o.   MRN: [id number]  Symptoms began Monday.  Symptoms include severe fevers, body aches, chills, cough, sinus congestion.  Patient vomited once this morning.  He denies any chest pain or shortness of breath.  He denies any otalgia or sore throat. Past Medical History:  Diagnosis Date   Gout    Hypertension    Internal hemorrhoids    No past surgical history on file. Current Outpatient Medications on File Prior to Visit  Medication Sig Dispense Refill   allopurinol  (ZYLOPRIM ) 100 MG tablet Take 3 tablets (300 mg total) by mouth daily. 180 tablet 2   amLODipine  (NORVASC ) 10 MG tablet TAKE 1 TABLET BY MOUTH EVERY DAY 90 tablet 3   colchicine  0.6 MG tablet Take 1 tablet (0.6 mg total) by mouth daily. Take 2 pills immediately at the first sign of gout. PRN 30 tablet 3   hydrOXYzine  (VISTARIL ) 25 MG capsule Take 1 capsule (25 mg total) by mouth every 8 (eight) hours as needed (sleep). 30 capsule 4   meloxicam (MOBIC) 7.5 MG tablet Take 7.5 mg by mouth daily.     pseudoephedrine (SUDAFED) 30 MG tablet Take 60 mg by mouth every 4 (four) hours as needed for congestion. (Patient not taking: Reported on 09/07/2024)     solifenacin  (VESICARE ) 10 MG tablet Take 1 tablet (10 mg total) by mouth daily. 90 tablet 1   zolpidem  (AMBIEN ) 10 MG tablet TAKE 1 TABLET BY MOUTH AT BEDTIME AS NEEDED FOR SLEEP (Patient not taking: Reported on 09/07/2024) 30 tablet 2   No current facility-administered medications on file prior to visit.   Allergies  Allergen Reactions   Codeine Other (See Comments) and Nausea Only    floor moving   Sulfa Antibiotics Nausea Only   Social History   Socioeconomic History   Marital status: Married    Spouse name: Not on file   Number of children: 1   Years of education: Not on file   Highest education level: 12th grade  Occupational History   Occupation: [employer]  Tobacco Use   Smoking status: Never    Smokeless tobacco: Never  Vaping Use   Vaping status: Never Used  Substance and Sexual Activity   Alcohol use: No   Drug use: No   Sexual activity: Not Currently  Other Topics Concern   Not on file  Social History Narrative   Not on file   Social Drivers of Health   Tobacco Use: Low Risk (09/07/2024)   Patient History    Smoking Tobacco Use: Never    Smokeless Tobacco Use: Never    Passive Exposure: Not on file  Financial Resource Strain: Low Risk (05/23/2023)   Overall Financial Resource Strain (CARDIA)    Difficulty of Paying Living Expenses: Not hard at all  Food Insecurity: No Food Insecurity (05/23/2023)   Hunger Vital Sign    Worried About Running Out of Food in the Last Year: Never true    Ran Out of Food in the Last Year: Never true  Transportation Needs: No Transportation Needs (05/23/2023)   PRAPARE - Administrator, Civil Service (Medical): No    Lack of Transportation (Non-Medical): No  Physical Activity: Insufficiently Active (05/23/2023)   Exercise Vital Sign    Days of Exercise per Week: 6 days    Minutes of Exercise per Session: 20 min  Stress: No Stress Concern Present (05/23/2023)  [employer] of Occupational Health - Occupational Stress Questionnaire    Feeling of Stress : Only a little  Social Connections: Socially Integrated (05/23/2023)   Social Connection and Isolation Panel    Frequency of Communication with Friends and Family: More than three times a week    Frequency of Social Gatherings with Friends and Family: More than three times a week    Attends Religious Services: More than 4 times per year    Active Member of [employer] or Organizations: Yes    Attends Banker Meetings: More than 4 times per year    Marital Status: Married  Catering Manager Violence: Not on file  Depression (PHQ2-9): Medium Risk (12/05/2023)   Depression (PHQ2-9)    PHQ-2 Score: 6  Alcohol Screen: Not on file  Housing: Low Risk (05/23/2023)    Housing    Last Housing Risk Score: 0  Utilities: Not on file  Health Literacy: Not on file     Review of Systems  All other systems reviewed and are negative.      Objective:   Physical Exam Vitals reviewed.  Constitutional:      Appearance: Normal appearance. He is normal weight. He is not ill-appearing, toxic-appearing or diaphoretic.  HENT:     Right Ear: Tympanic membrane and ear canal normal.     Left Ear: Tympanic membrane and ear canal normal.     Nose: Congestion and rhinorrhea present.     Left Sinus: Maxillary sinus tenderness and frontal sinus tenderness present.     Mouth/Throat:     Pharynx: No oropharyngeal exudate or posterior oropharyngeal erythema.  Eyes:     Conjunctiva/sclera: Conjunctivae normal.  Cardiovascular:     Rate and Rhythm: Normal rate and regular rhythm.     Heart sounds: Normal heart sounds. No murmur heard.    No friction rub. No gallop.  Pulmonary:     Effort: Pulmonary effort is normal. No respiratory distress.     Breath sounds: No stridor. No wheezing, rhonchi or rales.  Lymphadenopathy:     Cervical: No cervical adenopathy.  Skin:    General: Skin is warm and dry.     Coloration: Skin is not jaundiced or pale.     Findings: No bruising, erythema, lesion or rash.  Neurological:     General: No focal deficit present.     Mental Status: He is alert.            . Assessment & Plan:  Chills - Plan: Influenza A and B Ag, Immunoassay  Body aches - Plan: Influenza A and B Ag, Immunoassay Symptoms sound consistent with a viral upper respiratory infection.  Will screen the patient for the flu.  Flu is negative.  Recommend supportive care.  Ibuprofen  800 mg every 8 hours as needed for fever.  Can supplement with Tylenol  500 mg every 6 hours as needed for fever and bodyaches.  Push fluids.  Patient can use Coricidin HBP for head congestion and rhinorrhea.

## 2024-11-26 ENCOUNTER — Ambulatory Visit: Payer: Self-pay

## 2024-11-26 ENCOUNTER — Other Ambulatory Visit: Payer: Self-pay

## 2024-11-26 DIAGNOSIS — R6883 Chills (without fever): Secondary | ICD-10-CM

## 2024-11-26 DIAGNOSIS — R52 Pain, unspecified: Secondary | ICD-10-CM

## 2024-11-26 DIAGNOSIS — R509 Fever, unspecified: Secondary | ICD-10-CM

## 2024-11-26 MED ORDER — AZITHROMYCIN 250 MG PO TABS: ORAL_TABLET | ORAL | 0 refills | Status: AC

## 2024-11-26 NOTE — Telephone Encounter (Signed)
 FYI Only or Action Required?: Action required by provider: update on patient condition. States he feels mostly better but has bad green sinus congestion and had a nose bleed. Does not want to come in.   Patient was last seen in primary care on 11/22/2024 by Duanne Butler DASEN, MD.  Called Nurse Triage reporting No chief complaint on file..  Symptoms began a week ago.  Interventions attempted: OTC medications: Advil .  Symptoms are: gradually improving.  Triage Disposition: See HCP Within 4 Hours (Or PCP Triage)  Patient/caregiver understands and will follow disposition?:   Copied from CRM #[id number]. Topic: Clinical - Red Word Triage >> Nov 26, 2024 11:06 AM Zebedee SAUNDERS wrote: Red Word that prompted transfer to Nurse Triage: Pt came in last Thursday for flu but now has a nose bleed for the last 24 hours. Reason for Disposition  [1] SEVERE sinus pain (e.g., excruciating) AND [2] not improved 2 hours after pain medicine  Answer Assessment - Initial Assessment Questions Green mucous slight last week, got worse last night. Sneezed and had a bloody nose yesterday. Robitussin DM for cough. Tested negative for flu. Was seen last week, did not get antibiotics. Feels mostly better this week but sinus congestion is bad and nosebleed is new. 1. LOCATION: Where does it hurt?      In between eyes 2. ONSET: When did the sinus pain start?  (e.g., hours, days)      1 week ago 3. SEVERITY: How bad is the pain?   (Scale 0-10; or none, mild, moderate or severe)     Severe 4. RECURRENT SYMPTOM: Have you ever had sinus problems before? If Yes, ask: When was the last time? and What happened that time?      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 Says he had a long running history of a cold turning into sinus infection. 5. NASAL CONGESTION: Is the nose blocked? If Yes, ask: Can you open it or must you breathe through your mouth?     Blocked 6. NASAL DISCHARGE: Do you have  discharge from your nose? If so ask, What color?     Green 7. FEVER: Do you have a fever? If Yes, ask: What is it, how was it measured, and when did it start?      Denies 8. OTHER SYMPTOMS: Do you have any other symptoms? (e.g., sore throat, cough, earache, difficulty breathing)     Slight body aches, nosebleed  Protocols used: Sinus Pain or Congestion-A-AH

## 2024-11-27 ENCOUNTER — Ambulatory Visit: Admitting: Family Medicine
